# Patient Record
Sex: Female | Born: 1985 | Race: White | Hispanic: No | Marital: Married | Smoking: Never smoker
Health system: Southern US, Community
[De-identification: ages and names within clinical notes are randomized; demographics above are authoritative.]

## PROBLEM LIST (undated history)

## (undated) ENCOUNTER — Inpatient Hospital Stay (HOSPITAL_COMMUNITY): Payer: Self-pay

## (undated) DIAGNOSIS — Z8759 Personal history of other complications of pregnancy, childbirth and the puerperium: Secondary | ICD-10-CM

## (undated) DIAGNOSIS — Z8679 Personal history of other diseases of the circulatory system: Secondary | ICD-10-CM

## (undated) DIAGNOSIS — K219 Gastro-esophageal reflux disease without esophagitis: Secondary | ICD-10-CM

## (undated) DIAGNOSIS — F419 Anxiety disorder, unspecified: Secondary | ICD-10-CM

## (undated) DIAGNOSIS — I10 Essential (primary) hypertension: Secondary | ICD-10-CM

## (undated) DIAGNOSIS — Z9889 Other specified postprocedural states: Secondary | ICD-10-CM

## (undated) DIAGNOSIS — R112 Nausea with vomiting, unspecified: Secondary | ICD-10-CM

## (undated) HISTORY — DX: Gastro-esophageal reflux disease without esophagitis: K21.9

## (undated) HISTORY — DX: Anxiety disorder, unspecified: F41.9

## (undated) HISTORY — PX: MOUTH SURGERY: SHX715

## (undated) HISTORY — DX: Essential (primary) hypertension: I10

## (undated) HISTORY — PX: WISDOM TOOTH EXTRACTION: SHX21

---

## 2006-02-07 ENCOUNTER — Ambulatory Visit: Payer: Self-pay | Admitting: Family Medicine

## 2006-02-22 ENCOUNTER — Ambulatory Visit: Payer: Self-pay | Admitting: Family Medicine

## 2006-03-25 ENCOUNTER — Ambulatory Visit: Payer: Self-pay | Admitting: Family Medicine

## 2016-04-27 ENCOUNTER — Encounter (HOSPITAL_COMMUNITY): Payer: Self-pay | Admitting: *Deleted

## 2016-04-27 ENCOUNTER — Inpatient Hospital Stay (HOSPITAL_COMMUNITY)
Admission: AD | Admit: 2016-04-27 | Discharge: 2016-04-28 | Disposition: A | Discharge status: Other Acute Inpatient Hospital | Payer: BLUE CROSS/BLUE SHIELD | Source: Ambulatory Visit | Attending: Obstetrics and Gynecology | Admitting: Obstetrics and Gynecology

## 2016-04-27 DIAGNOSIS — O321XX0 Maternal care for breech presentation, not applicable or unspecified: Secondary | ICD-10-CM | POA: Diagnosis not present

## 2016-04-27 DIAGNOSIS — O133 Gestational [pregnancy-induced] hypertension without significant proteinuria, third trimester: Secondary | ICD-10-CM | POA: Insufficient documentation

## 2016-04-27 DIAGNOSIS — O42913 Preterm premature rupture of membranes, unspecified as to length of time between rupture and onset of labor, third trimester: Secondary | ICD-10-CM | POA: Diagnosis not present

## 2016-04-27 DIAGNOSIS — Z3A33 33 weeks gestation of pregnancy: Secondary | ICD-10-CM | POA: Diagnosis not present

## 2016-04-27 LAB — COMPREHENSIVE METABOLIC PANEL
ALBUMIN: 2.6 g/dL — AB (ref 3.5–5.0)
ALK PHOS: 121 U/L (ref 38–126)
ALT: 11 U/L — AB (ref 14–54)
AST: 15 U/L (ref 15–41)
Anion gap: 7 (ref 5–15)
BUN: 8 mg/dL (ref 6–20)
CALCIUM: 8.9 mg/dL (ref 8.9–10.3)
CO2: 21 mmol/L — AB (ref 22–32)
CREATININE: 0.51 mg/dL (ref 0.44–1.00)
Chloride: 107 mmol/L (ref 101–111)
GFR calc non Af Amer: >60 mL/min (ref >60)
Glucose, Bld: 99 mg/dL (ref 65–99)
Potassium: 3.7 mmol/L (ref 3.5–5.1)
Sodium: 135 mmol/L (ref 135–145)
Total Bilirubin: 0.4 mg/dL (ref 0.3–1.2)
Total Protein: 6 g/dL — ABNORMAL LOW (ref 6.5–8.1)

## 2016-04-27 LAB — URINALYSIS, ROUTINE W REFLEX MICROSCOPIC
BILIRUBIN URINE: NEGATIVE
GLUCOSE, UA: NEGATIVE mg/dL
Ketones, ur: NEGATIVE mg/dL
Leukocytes, UA: NEGATIVE
Nitrite: NEGATIVE
Protein, ur: NEGATIVE mg/dL
pH: 5.5 (ref 5.0–8.0)

## 2016-04-27 LAB — CBC
HCT: 32 % — ABNORMAL LOW (ref 36.0–46.0)
HEMOGLOBIN: 10.9 g/dL — AB (ref 12.0–15.0)
MCH: 28.4 pg (ref 26.0–34.0)
MCHC: 34.1 g/dL (ref 30.0–36.0)
MCV: 83.3 fL (ref 78.0–100.0)
PLATELETS: 244 10*3/uL (ref 150–400)
RBC: 3.84 MIL/uL — AB (ref 3.87–5.11)
RDW: 13.3 % (ref 11.5–15.5)
WBC: 11.2 10*3/uL — ABNORMAL HIGH (ref 4.0–10.5)

## 2016-04-27 LAB — GROUP B STREP BY PCR: Group B strep by PCR: NEGATIVE

## 2016-04-27 LAB — URIC ACID: Uric Acid, Serum: 3.9 mg/dL (ref 2.3–6.6)

## 2016-04-27 LAB — POCT FERN TEST: POCT Fern Test: POSITIVE

## 2016-04-27 LAB — URINE MICROSCOPIC-ADD ON

## 2016-04-27 MED ORDER — DEXTROSE 5 % IV SOLN
500.0000 mg | Freq: Once | INTRAVENOUS | Status: AC
  Administered 2016-04-27: 500 mg via INTRAVENOUS
  Filled 2016-04-27: qty 500

## 2016-04-27 MED ORDER — BETAMETHASONE SOD PHOS & ACET 6 (3-3) MG/ML IJ SUSP
12.0000 mg | Freq: Once | INTRAMUSCULAR | Status: AC
  Administered 2016-04-27: 12 mg via INTRAMUSCULAR
  Filled 2016-04-27: qty 2

## 2016-04-27 MED ORDER — LABETALOL HCL 5 MG/ML IV SOLN
INTRAVENOUS | Status: DC
Start: 2016-04-27 — End: 2016-04-28
  Filled 2016-04-27: qty 4

## 2016-04-27 MED ORDER — MAGNESIUM SULFATE BOLUS VIA INFUSION
4.0000 g | Freq: Once | INTRAVENOUS | Status: AC
  Administered 2016-04-27: 4 g via INTRAVENOUS
  Filled 2016-04-27 (×2): qty 500

## 2016-04-27 MED ORDER — LABETALOL HCL 5 MG/ML IV SOLN
20.0000 mg | Freq: Once | INTRAVENOUS | Status: AC
  Administered 2016-04-27: 20 mg via INTRAVENOUS

## 2016-04-27 MED ORDER — MAGNESIUM SULFATE 50 % IJ SOLN
2.0000 g/h | INTRAMUSCULAR | Status: DC
  Filled 2016-04-27: qty 80

## 2016-04-27 MED ORDER — SODIUM CHLORIDE 0.9 % IV SOLN
2.0000 g | Freq: Once | INTRAVENOUS | Status: AC
  Administered 2016-04-27: 2 g via INTRAVENOUS
  Filled 2016-04-27: qty 2000

## 2016-04-27 MED ORDER — LACTATED RINGERS IV SOLN
INTRAVENOUS | Status: DC
  Administered 2016-04-27: 23:00:00 via INTRAVENOUS

## 2016-04-27 MED ORDER — MAGNESIUM SULFATE 4 GM/100ML IV SOLN: 4.0000 g | Freq: Once | INTRAVENOUS | Status: DC

## 2016-04-27 NOTE — MAU Note (Signed)
Pt reports ? Leaking fluid since 2000, some lower abd cramping.

## 2016-04-27 NOTE — H&P (Addendum)
Mikayla Becker is a 30 y.o. female presenting @ 72 6/[redacted] weeks gestation with c/o leaking  Clear fluid since 8pm tonight. Pt notes nl fetal activity. PNC complicated by fetal echo done( Dr Elizebeth Brooking) for ? abnl tricuspid valve showed ? Poss ebstein anomaly. Pt is to be delivered at [facility] due to need for pediatric cardiac service there per Dr Elizebeth Brooking. In [place], pt was noted to have elevated BP. Pt denies h/a, visual changes or epigastric pain. Pt noted some RUQ discomfort today. (+) leg swelling. Last sono 8/21: 3lb 3 oz(89%)  OB History    Gravida Para Term Preterm AB Living   1             SAB TAB Ectopic Multiple Live Births                 History reviewed. No pertinent past medical history. History reviewed. No pertinent surgical history. Family History: family history is not on file. Social History:  reports that she has never smoked. She has never used smokeless tobacco. She reports that she does not drink alcohol or use drugs.     Maternal Diabetes: No Genetic Screening: Declined Maternal Ultrasounds/Referrals: Normal Fetal Ultrasounds or other Referrals:  Fetal echo, Other: poss ebstein"s anomaly Maternal Substance Abuse:  No Significant Maternal Medications:  None Significant Maternal Lab Results:  Lab values include: Other: GBS cx pending Other Comments:  seen by Dr Elizebeth Brooking for poss abnormality of tricuspid ? ebstein anomaly  Review of Systems  Eyes: Negative for blurred vision and double vision.  Cardiovascular: Positive for leg swelling.  Gastrointestinal: Positive for abdominal pain.  Neurological: Negative for headaches.   History Dilation: Fingertip (visually) Exam by:: Dr. Cherly Hensen  Blood pressure (!) 139/117, pulse (!) 123, temperature 98.9 F (37.2 C), temperature source Oral, resp. rate 19, height 5' 5.5" (1.664 m), weight 91.2 kg (201 lb), SpO2 99 %.  BP 149/98 (BP Location: Right Arm)   Pulse 108   Temp 98 F (36.7 C) (Oral)   Resp 18   Ht 5' 5.5" (1.664 m)    Wt 91.2 kg (201 lb)   SpO2 99%   BMI 32.94 kg/m   Exam Physical Exam  Constitutional: She is oriented to person, place, and time. She appears well-developed and well-nourished.  HENT:  Head: Atraumatic.  Eyes: EOM are normal.  Neck: Neck supple.  Cardiovascular: Regular rhythm.   Respiratory: Breath sounds normal.  GI: Soft.  Musculoskeletal: She exhibits edema.  Neurological: She is alert and oriented to person, place, and time.  Skin: Skin is warm and dry.  Psychiatric: She has a normal mood and affect.   SSE:  Clear amniotic fluid Cervix closed/long  bedside sono: breech  Prenatal labs: ABO, Rh:  AB positive Antibody:  negative Rubella:  Immune RPR:   NR HBsAg:   neg HIV:   neg GBS:   pending CBC    Component Value Date/Time   WBC 11.2 (H) 04/27/2016 2205   RBC 3.84 (L) 04/27/2016 2205   HGB 10.9 (L) 04/27/2016 2205   HCT 32.0 (L) 04/27/2016 2205   PLT 244 04/27/2016 2205   MCV 83.3 04/27/2016 2205   MCH 28.4 04/27/2016 2205   MCHC 34.1 04/27/2016 2205   RDW 13.3 04/27/2016 2205   CMP Latest Ref Rng & Units 04/27/2016  Glucose 65 - 99 mg/dL 99  BUN 6 - 20 mg/dL 8  Creatinine 1.61 - 0.96 mg/dL 0.45  Sodium 409 - 811 mmol/L 135  Potassium  3.5 - 5.1 mmol/L 3.7  Chloride 101 - 111 mmol/L 107  CO2 22 - 32 mmol/L 21(L)  Calcium 8.9 - 10.3 mg/dL 8.9  Total Protein 6.5 - 8.1 g/dL 6.0(L)  Total Bilirubin 0.3 - 1.2 mg/dL 0.4  Alkaline Phos 38 - 126 U/L 121  AST 15 - 41 U/L 15  ALT 14 - 54 U/L 11(L)   Tracing: baseline 140 (+) accel Ctx 2-564mins Assessment/Plan: PPROM Breech IUP @ 33 6/7 weeks PIH new onset Possible fetal cardiac anomaly P) GBS cx by PCR. Magnesium Sulfate bolus then 2g/hr. Ampicillin/Azithromycin ( PPROm antibiotic protocol), BMZ. PIH labs. [facility]( Dr Eliezer Bottomachel Urriut[facility] due to need for fetal cardiac issue and NICU closed here  Erskine Steinfeldt A 04/27/2016, 10:46 PM  Addendum:  GBS culture neg. Protein/creatinine  ratio pending IV labetalol given for  Elevated BP

## 2016-04-28 ENCOUNTER — Encounter (HOSPITAL_COMMUNITY): Payer: Self-pay

## 2016-04-28 LAB — PROTEIN / CREATININE RATIO, URINE: Creatinine, Urine: 18 mg/dL

## 2016-06-09 ENCOUNTER — Inpatient Hospital Stay (HOSPITAL_COMMUNITY): Admission: AD | Admit: 2016-06-09 | Payer: Self-pay | Source: Ambulatory Visit | Admitting: Obstetrics and Gynecology

## 2017-03-02 ENCOUNTER — Encounter (HOSPITAL_COMMUNITY): Payer: Self-pay

## 2019-10-31 ENCOUNTER — Other Ambulatory Visit: Payer: Self-pay

## 2020-02-14 ENCOUNTER — Ambulatory Visit: Payer: 59 | Attending: Obstetrics and Gynecology

## 2020-02-14 ENCOUNTER — Other Ambulatory Visit: Payer: Self-pay | Admitting: Obstetrics and Gynecology

## 2020-02-14 ENCOUNTER — Ambulatory Visit (HOSPITAL_BASED_OUTPATIENT_CLINIC_OR_DEPARTMENT_OTHER): Payer: 59 | Admitting: Obstetrics and Gynecology

## 2020-02-14 ENCOUNTER — Other Ambulatory Visit: Payer: Self-pay

## 2020-02-14 ENCOUNTER — Ambulatory Visit: Payer: 59 | Admitting: *Deleted

## 2020-02-14 ENCOUNTER — Ambulatory Visit: Payer: 59

## 2020-02-14 DIAGNOSIS — O34219 Maternal care for unspecified type scar from previous cesarean delivery: Secondary | ICD-10-CM

## 2020-02-14 DIAGNOSIS — O09299 Supervision of pregnancy with other poor reproductive or obstetric history, unspecified trimester: Secondary | ICD-10-CM | POA: Diagnosis not present

## 2020-02-14 DIAGNOSIS — O09212 Supervision of pregnancy with history of pre-term labor, second trimester: Secondary | ICD-10-CM | POA: Diagnosis not present

## 2020-02-14 DIAGNOSIS — Z3A27 27 weeks gestation of pregnancy: Secondary | ICD-10-CM | POA: Diagnosis not present

## 2020-02-14 DIAGNOSIS — O3622X0 Maternal care for hydrops fetalis, second trimester, not applicable or unspecified: Secondary | ICD-10-CM | POA: Insufficient documentation

## 2020-02-14 DIAGNOSIS — Z3681 Encounter for antenatal screening for hydrops fetalis: Secondary | ICD-10-CM

## 2020-02-14 DIAGNOSIS — O359XX0 Maternal care for (suspected) fetal abnormality and damage, unspecified, not applicable or unspecified: Secondary | ICD-10-CM | POA: Diagnosis not present

## 2020-02-14 DIAGNOSIS — O350XX0 Maternal care for (suspected) central nervous system malformation in fetus, not applicable or unspecified: Secondary | ICD-10-CM | POA: Insufficient documentation

## 2020-02-14 DIAGNOSIS — Z363 Encounter for antenatal screening for malformations: Secondary | ICD-10-CM

## 2020-02-14 DIAGNOSIS — Z8279 Family history of other congenital malformations, deformations and chromosomal abnormalities: Secondary | ICD-10-CM

## 2020-02-14 DIAGNOSIS — O3509X0 Maternal care for (suspected) other central nervous system malformation or damage in fetus, not applicable or unspecified: Secondary | ICD-10-CM

## 2020-02-14 NOTE — Progress Notes (Signed)
Maternal-Fetal Medicine   Name: Mikayla Becker DOB: 05/20/1996 MRN: [id number] Referring Provider: Maxie Better, MD  I had the pleasure of seeing Mikayla Becker today at the [facility].  She is here for a second-opinion ultrasound and MFM consultation.  At your office ultrasound performed today, fetal hydrops was seen.  Mild ventriculomegaly was also seen.  Her prenatal course has been, otherwise, uneventful.  On cell free fetal DNA screening, the risks of aneuploidies not increased.  MSAFP screening showed low risk for open neural tube defects.  Her blood pressures have been within normal range and she does not have gestational diabetes.  Mid-trimester fetal anatomical survey performed at your office was normal.  She takes weekly 17 OH progesterone injections (history of preterm delivery).  Patient had fetal echocardiography last month that was reported as normal.  Tricuspid valve appeared normal and mild right ventricular dilation was seen.  Patient does not give history of fever or rashes in this pregnancy.  Obstetric history significant for a preterm cesarean delivery at [redacted] weeks gestation in September 2017 at [facility] of a female infant weighing 5 pounds 9 ounces at birth.  Patient had PPROM and breech presentation.  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  Her son is doing well now and did not have cardiac surgery.  Past medical history: No history of hypertension or diabetes.  She had thyroid nodule, but no hyper or hypothyroidism. Past surgical history: Cesarean section. Medications: Prenatal vitamins, Lexapro 20 mg daily, low-dose aspirin, 17-OH progesterone injections. Allergies: No known drug allergies. Social history: Denies tobacco drug or alcohol use.  She has been married 6 years and her husband is in good health.   Family history: No history of venous thromboembolism in the family.  Labs: Hemoglobin  11.4, MCV 82.8, blood type AB positive, antibody screen negative, RPR nonreactive, HIV negative, HBsAg negative, rubella immune, GCT 1 hour 138 mg/dL.  Ultrasound We performed a fetal anatomical survey.  The estimated fetal weight is at the 98th percentile.  Amniotic fluid is normal and good fetal activity seen.  Cephalic presentation.  Following findings are seen: -Severe ascites and generalized skin edema were seen. -No pericardial or pleural effusions are seen. -Intracranial structures, spine, kidneys, face including lips appear normal.  No evidence of ventriculomegaly. -No obvious chest mass or tumors are seen.  -Placenta appears normal with no evidence of tumors or placentomegaly. -Right atrium appears dilated and tricuspid valve insertion appears normal. -I could not identify aortic arch clearly. -Umbilical artery Doppler showed persistent absent end-diastolic flow. -Middle cerebral artery Doppler showed normal peak systolic velocity measurements (no evidence of fetal anemia).  I explained the findings and counseled her on the following:  Hydrops fetalis: -It is accumulation of excess fluid in one or more fetal compartments. -It has several causes including fetal infections, chromosomal anomalies, congenital malformations (including cardiac), fetal tumors, fetal anemia.  -Exact cause may be difficult to determine in some cases; exclusion of some conditions is possible.  -Based on our findings, I strongly suspect cardiac anomaly. Right atrial dilation and non-visualization of aorta can be associated with coarctation of aorta. Ebstein anomaly is a possibility (unlikely).  -Diffferential diagnosis include fetal infections, and I discussed screening for Parvovirus B 19, CMV and Toxoplasmosis infections. Amniotic fluid can also be analyzed for CMV and Toxoplasmosis PCR.  -Fetal hydrops is associated with more-commonly with fetal chromosomal anomalies. I recommended amniocentesis to rule  out fetal chromosomal anomalies. Microarray analysis can  be performed to rule out some (not all) genetic conditions. I discussed the limitations of cell-free fetal DNA screening. I explained amniocentesis procedure and possible complications including preterm delivery (1 in 500 procedures). Preterm delivery is higher in her case because of fetal hydrops and her history of preterm delivery.  -Normal MCA Doppler study is reassuring, and fetal anemia is unlikely (more common with Parvovirus B19 infection).   -Metabolic or genetic causes are likely (rare), and amniocentesis may be able to determine some of these disorders.  -Umbilical artery showed absent-end-diastolic flow that suggests placental insufficiency.  I counseled the couple that hydrops fetalis is associated with a very high fetal mortality (greater than 50%) and the prognosis depends on the cause.  After counseling, the couple informed that they need time to decide on amniocentesis and will discuss with Dr. Cherly Hensen, her obstetrician.  I also discussed meeting with our genetic counselor later.  Patient was sent over to the lab to have her blood drawn (serology) for Parvovirus B19, CMV and toxoplasmosis. We will communicate our results to the patient and fax copies to you.   After the patient left, I called Dr. Dalene Seltzer, pediatric cardiologist [facility] [place]) and discussed the findings. He will be calling the patient tomorrow for an earlier appointment (initially scheduled for 02/22/20).  The couple is aware that if cardiac anomaly is confirmed, she may deliver at [facility].  Recommendations: -Follow-up fetal echocardiography with Dr. Elizebeth Brooking at the [facility]. -Amniocentesis for fetal karyotype, microarray and infectious work-up. -Follow-up ultrasound next week if she is not transferring her care to the [facility].  Thank you for ultrasound and consultation. If you have any questions or concerns, please call me at the  [facility].  Consultation including face-to-face counseling: 50 minutes.

## 2020-02-15 ENCOUNTER — Other Ambulatory Visit: Payer: Self-pay | Admitting: Obstetrics and Gynecology

## 2020-02-15 ENCOUNTER — Ambulatory Visit: Payer: Self-pay | Admitting: Genetic Counselor

## 2020-02-15 ENCOUNTER — Ambulatory Visit: Payer: 59

## 2020-02-15 ENCOUNTER — Ambulatory Visit: Payer: 59 | Admitting: *Deleted

## 2020-02-15 ENCOUNTER — Ambulatory Visit: Payer: 59 | Attending: Obstetrics and Gynecology

## 2020-02-15 VITALS — BP 143/79 | HR 99

## 2020-02-15 DIAGNOSIS — Z36 Encounter for antenatal screening for chromosomal anomalies: Secondary | ICD-10-CM

## 2020-02-15 DIAGNOSIS — Z8279 Family history of other congenital malformations, deformations and chromosomal abnormalities: Secondary | ICD-10-CM | POA: Diagnosis not present

## 2020-02-15 DIAGNOSIS — O359XX0 Maternal care for (suspected) fetal abnormality and damage, unspecified, not applicable or unspecified: Secondary | ICD-10-CM | POA: Diagnosis not present

## 2020-02-15 DIAGNOSIS — O09292 Supervision of pregnancy with other poor reproductive or obstetric history, second trimester: Secondary | ICD-10-CM | POA: Insufficient documentation

## 2020-02-15 DIAGNOSIS — O09212 Supervision of pregnancy with history of pre-term labor, second trimester: Secondary | ICD-10-CM | POA: Diagnosis not present

## 2020-02-15 DIAGNOSIS — O283 Abnormal ultrasonic finding on antenatal screening of mother: Secondary | ICD-10-CM | POA: Insufficient documentation

## 2020-02-15 DIAGNOSIS — Z8759 Personal history of other complications of pregnancy, childbirth and the puerperium: Secondary | ICD-10-CM | POA: Insufficient documentation

## 2020-02-15 DIAGNOSIS — Z3A27 27 weeks gestation of pregnancy: Secondary | ICD-10-CM | POA: Diagnosis not present

## 2020-02-15 DIAGNOSIS — O3622X0 Maternal care for hydrops fetalis, second trimester, not applicable or unspecified: Secondary | ICD-10-CM | POA: Insufficient documentation

## 2020-02-15 DIAGNOSIS — O34219 Maternal care for unspecified type scar from previous cesarean delivery: Secondary | ICD-10-CM | POA: Insufficient documentation

## 2020-02-15 DIAGNOSIS — O0990 Supervision of high risk pregnancy, unspecified, unspecified trimester: Secondary | ICD-10-CM

## 2020-02-15 LAB — PARVOVIRUS B19 ANTIBODY, IGG AND IGM
Parvovirus B19 IgG: 0.2 index (ref 0.0–0.8)
Parvovirus B19 IgM: 0.1 index (ref 0.0–0.8)

## 2020-02-15 LAB — TOXOPLASMA GONDII ANTIBODY, IGG: Toxoplasma IgG Ratio: <3 IU/mL (ref 0.0–7.1)

## 2020-02-15 LAB — INFECT DISEASE AB IGM REFLEX 1

## 2020-02-15 LAB — CMV IGM: CMV IgM Ser EIA-aCnc: <30 AU/mL (ref 0.0–29.9)

## 2020-02-15 LAB — TOXOPLASMA GONDII ANTIBODY, IGM: Toxoplasma Antibody- IgM: <3 AU/mL (ref 0.0–7.9)

## 2020-02-15 LAB — CMV ANTIBODY, IGG (EIA): CMV Ab - IgG: 2.7 U/mL — ABNORMAL HIGH (ref 0.00–0.59)

## 2020-02-15 NOTE — Progress Notes (Signed)
Pt reports headache last night that has resolved this morning, feeling nauseous and stressed. Will repeat BP after amniocentesis.

## 2020-02-15 NOTE — Progress Notes (Signed)
I briefly met with Mikayla Becker and Mikayla Becker today to review testing to be ordered on Mikayla amniocentesis sample. We discussed sending the amniocentesis sample to [facility] for karyotype and CMV analysis. We also discussed the option of ordering a chromosomal microarray to assess for smaller deletions or duplications of chromosomal material that fall below the detection range of karyotype. Finally, we discussed the option of sending a portion of the sample to the laboratory [facility], as they offer a hydrops multigene sequencing panel.   The couple was counseled on the types of results that would be possible from all of this testing. The first type of result is positive. This means that the fetus has a chromosomal change or a pathogenic variant in a gene known to be associated with hydrops. The second type of result is negative. This means that the fetus does not have a chromosomal change or a pathogenic variant in any of the genes that were tested. A negative result does not exclude the possibility of a genetic condition in the current fetus. This is because it is possible that the fetus could carry a variant in a part of a gene that was not sequenced or in a different gene altogether. However, it is also possible that the hydrops in the current fetus does not have a genetic cause, as ~20% of hydrops cases are idiopathic. The third and final result type is a variant of uncertain significance (VUS). A VUS means that a change was identified in one of the chromosomal regions or genes that was tested; however, it is unclear if this change is benign (not associated with hydrops) or pathogenic (is known to be associated with hydrops). In this case, parental testing would likely be recommended. The couple was counseled that the uncertainty surrounding a VUS can be anxiety-provoking for some individuals.   Mikayla Becker and Mikayla Becker understood the benefits and limitations of testing. They indicated that they  were interested in pursuing all of these testing options to gain the most information about the fetus as possible. They understand that it is possible that all of the testing may come back negative.   I informed Mikayla Becker that some insurance companies require prior authorization for chromosomal microarray and multigene panels. We also discussed that it is not guaranteed that [facility] could provide a courtesy forward to [employer] Mikayla Becker requested that we have [facility] hold cells from the amniocentesis sample and order chromosomal microarray and the hydrops panel once prior authorization has been obtained and it is determined that a courtesy forward is possible.   Results from karyotype take 9-12 days to be returned. If chromosomal microarray is ordered, results take 8-15 days to be returned. The couple understands that results may be delayed if cultured cells are needed to complete either of those tests. If the hydrops panel is ordered, results could take 8-10 weeks to be returned. If the fetus is delivered prematurely, it is possible that results from the panel testing may not be back before the baby is born. The couple informed me that termination of pregnancy is not something that they are considering, so a delay in results is not of concern. Additionally, they feel that information from the hydrops panel could still be beneficial in providing information about prognosis, other features that may be expected, and recurrence risks should a positive result be returned.  Both Mikayla Becker and Mikayla Becker confirmed that they had no further questions at this time. I provided them with my  contact information and encouraged them to reach out if they have any questions or concerns before I call them with results.  Following our discussion, I contacted [employer] who informed me that prior authorization is not needed for the CPT codes associated with chromosomal microarray and the hydrops panel.  Additionally, [facility] indicated that they can provide a courtesy forward to [employer] I placed the order for chromosomal microarray so that testing can begin immediately. I will call Mikayla Becker on Monday to inquire how many flasks of cells are needed for testing, then will place the order from the hydrops panel from there. I will also contact Mikayla Becker on Monday to provide Mikayla with these updates.  Mikayla Manis, MS, [facility] Genetic Counselor

## 2020-02-18 ENCOUNTER — Other Ambulatory Visit: Payer: Self-pay

## 2020-02-18 ENCOUNTER — Telehealth: Payer: Self-pay | Admitting: *Deleted

## 2020-02-19 ENCOUNTER — Telehealth: Payer: Self-pay | Admitting: Genetic Counselor

## 2020-02-19 ENCOUNTER — Other Ambulatory Visit: Payer: Self-pay | Admitting: *Deleted

## 2020-02-19 NOTE — Telephone Encounter (Signed)
LVM for Ms. Feigel to update her about testing on her amniocentesis sample. Ms. 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 At that time, we discussed the possibility of ordering karyotype, chromosomal microarray, and a hydrops panel. I informed Ms. Guardiola that her insurance company did not require prior authorization for the chromosomal microarray. However, I called [facility], the laboratory that offers the hydrops gene sequencing panel, and they informed me that they are not in-network with [employer]. I informed Ms. Stigler that I can request gap exception from [employer] to have the cost of the testing covered at an in-network level. However, given that Ms. Rosenberry is now admitted at [facility], it may be possible for her to enroll in a free exome sequencing research study. This would look at all of the genes in the baby as opposed to a selection of some genes known to be associated with hydrops. I asked Ms. Standlee to call me back and let me know if she was interested in pursuing this option instead. In the meantime, I reached out to Beacher May, the genetic counselor at [facility] who is the coordinator for the prenatal exome study, to see if it would be possible to enroll this patient.  Gershon Crane, MS, [facility] Genetic Counselor

## 2020-02-20 ENCOUNTER — Other Ambulatory Visit: Payer: Self-pay | Admitting: *Deleted

## 2020-02-21 ENCOUNTER — Ambulatory Visit: Payer: 59 | Attending: Obstetrics

## 2020-02-21 ENCOUNTER — Ambulatory Visit: Payer: 59 | Admitting: *Deleted

## 2020-02-21 ENCOUNTER — Other Ambulatory Visit: Payer: Self-pay

## 2020-02-21 VITALS — BP 125/75 | HR 93

## 2020-02-21 DIAGNOSIS — Z3A28 28 weeks gestation of pregnancy: Secondary | ICD-10-CM | POA: Diagnosis not present

## 2020-02-21 DIAGNOSIS — O359XX0 Maternal care for (suspected) fetal abnormality and damage, unspecified, not applicable or unspecified: Secondary | ICD-10-CM | POA: Diagnosis not present

## 2020-02-21 DIAGNOSIS — O0990 Supervision of high risk pregnancy, unspecified, unspecified trimester: Secondary | ICD-10-CM

## 2020-02-21 DIAGNOSIS — O34219 Maternal care for unspecified type scar from previous cesarean delivery: Secondary | ICD-10-CM | POA: Diagnosis not present

## 2020-02-21 DIAGNOSIS — Z8279 Family history of other congenital malformations, deformations and chromosomal abnormalities: Secondary | ICD-10-CM

## 2020-02-21 DIAGNOSIS — O09293 Supervision of pregnancy with other poor reproductive or obstetric history, third trimester: Secondary | ICD-10-CM | POA: Diagnosis not present

## 2020-02-21 DIAGNOSIS — O3623X0 Maternal care for hydrops fetalis, third trimester, not applicable or unspecified: Secondary | ICD-10-CM | POA: Diagnosis not present

## 2020-02-21 DIAGNOSIS — O09213 Supervision of pregnancy with history of pre-term labor, third trimester: Secondary | ICD-10-CM

## 2020-02-21 NOTE — Telephone Encounter (Signed)
Received a call back from Ms. Carapia. She informed me that they would like to pursue [facility]'s exome sequencing research study and that Cloyd Stagers had already contacted her this morning to discuss this with her in further detail. Once Ms. Madlock's chromosomal microarray results are returned, I will contact Claiborne Billings and fill out paperwork authorizing [facility] to release some of Ms. Hunsinger's sample from her amniocentesis to [place]. Ms. Rocca husband will collect a sample via saliva kit, and we will facilitate a blood draw for Ms. Witt at one of her upcoming appointments here. I informed Ms. Boulet that if enrolling in the research study does not work out for some reason, I can contact her insurance company and request gap coverage for the hydrops panel we had previously discussed ordering. Ms. Colvin was agreeable to this plan. I will contact her once her results from karyotype and chromosomal microarray become available.  Buelah Manis, MS, [facility] Genetic Counselor

## 2020-02-25 ENCOUNTER — Ambulatory Visit: Payer: 59 | Attending: Obstetrics

## 2020-02-25 ENCOUNTER — Ambulatory Visit: Payer: 59 | Admitting: *Deleted

## 2020-02-25 ENCOUNTER — Encounter: Payer: Self-pay | Admitting: *Deleted

## 2020-02-25 ENCOUNTER — Other Ambulatory Visit: Payer: Self-pay

## 2020-02-25 DIAGNOSIS — Z3A29 29 weeks gestation of pregnancy: Secondary | ICD-10-CM | POA: Diagnosis not present

## 2020-02-25 DIAGNOSIS — O34219 Maternal care for unspecified type scar from previous cesarean delivery: Secondary | ICD-10-CM | POA: Diagnosis not present

## 2020-02-25 DIAGNOSIS — O09293 Supervision of pregnancy with other poor reproductive or obstetric history, third trimester: Secondary | ICD-10-CM

## 2020-02-25 DIAGNOSIS — O09213 Supervision of pregnancy with history of pre-term labor, third trimester: Secondary | ICD-10-CM

## 2020-02-25 DIAGNOSIS — O359XX0 Maternal care for (suspected) fetal abnormality and damage, unspecified, not applicable or unspecified: Secondary | ICD-10-CM | POA: Diagnosis not present

## 2020-02-25 DIAGNOSIS — O3623X0 Maternal care for hydrops fetalis, third trimester, not applicable or unspecified: Secondary | ICD-10-CM | POA: Diagnosis not present

## 2020-02-25 DIAGNOSIS — Z8279 Family history of other congenital malformations, deformations and chromosomal abnormalities: Secondary | ICD-10-CM

## 2020-02-28 ENCOUNTER — Ambulatory Visit: Payer: 59 | Admitting: *Deleted

## 2020-02-28 ENCOUNTER — Ambulatory Visit: Payer: 59 | Attending: Obstetrics

## 2020-02-28 ENCOUNTER — Other Ambulatory Visit: Payer: Self-pay

## 2020-02-28 VITALS — BP 131/80 | HR 108

## 2020-02-28 DIAGNOSIS — O0990 Supervision of high risk pregnancy, unspecified, unspecified trimester: Secondary | ICD-10-CM | POA: Insufficient documentation

## 2020-02-28 DIAGNOSIS — O09213 Supervision of pregnancy with history of pre-term labor, third trimester: Secondary | ICD-10-CM | POA: Diagnosis not present

## 2020-02-28 DIAGNOSIS — Z8279 Family history of other congenital malformations, deformations and chromosomal abnormalities: Secondary | ICD-10-CM

## 2020-02-28 DIAGNOSIS — O09293 Supervision of pregnancy with other poor reproductive or obstetric history, third trimester: Secondary | ICD-10-CM | POA: Diagnosis not present

## 2020-02-28 DIAGNOSIS — O3623X0 Maternal care for hydrops fetalis, third trimester, not applicable or unspecified: Secondary | ICD-10-CM | POA: Diagnosis not present

## 2020-02-28 DIAGNOSIS — O359XX0 Maternal care for (suspected) fetal abnormality and damage, unspecified, not applicable or unspecified: Secondary | ICD-10-CM

## 2020-02-28 DIAGNOSIS — Z3A29 29 weeks gestation of pregnancy: Secondary | ICD-10-CM | POA: Diagnosis not present

## 2020-02-28 DIAGNOSIS — O34219 Maternal care for unspecified type scar from previous cesarean delivery: Secondary | ICD-10-CM

## 2020-03-03 ENCOUNTER — Other Ambulatory Visit: Payer: Self-pay | Admitting: *Deleted

## 2020-03-03 ENCOUNTER — Encounter: Payer: Self-pay | Admitting: Pediatrics

## 2020-03-03 ENCOUNTER — Other Ambulatory Visit: Payer: Self-pay

## 2020-03-03 ENCOUNTER — Other Ambulatory Visit: Payer: Self-pay | Admitting: Obstetrics

## 2020-03-03 ENCOUNTER — Ambulatory Visit: Payer: 59 | Attending: Obstetrics

## 2020-03-03 ENCOUNTER — Ambulatory Visit: Payer: 59 | Admitting: *Deleted

## 2020-03-03 ENCOUNTER — Encounter: Payer: Self-pay | Admitting: *Deleted

## 2020-03-03 DIAGNOSIS — Z3A30 30 weeks gestation of pregnancy: Secondary | ICD-10-CM

## 2020-03-03 DIAGNOSIS — O34219 Maternal care for unspecified type scar from previous cesarean delivery: Secondary | ICD-10-CM | POA: Diagnosis not present

## 2020-03-03 DIAGNOSIS — O358XX0 Maternal care for other (suspected) fetal abnormality and damage, not applicable or unspecified: Secondary | ICD-10-CM

## 2020-03-03 DIAGNOSIS — O283 Abnormal ultrasonic finding on antenatal screening of mother: Secondary | ICD-10-CM | POA: Insufficient documentation

## 2020-03-03 DIAGNOSIS — O09293 Supervision of pregnancy with other poor reproductive or obstetric history, third trimester: Secondary | ICD-10-CM

## 2020-03-03 DIAGNOSIS — O09213 Supervision of pregnancy with history of pre-term labor, third trimester: Secondary | ICD-10-CM | POA: Diagnosis not present

## 2020-03-03 DIAGNOSIS — Z8279 Family history of other congenital malformations, deformations and chromosomal abnormalities: Secondary | ICD-10-CM

## 2020-03-03 DIAGNOSIS — Z362 Encounter for other antenatal screening follow-up: Secondary | ICD-10-CM

## 2020-03-03 LAB — MCC TRACKING

## 2020-03-03 NOTE — Procedures (Signed)
Mikayla Becker [birth year] [redacted]w[redacted]d  Fetus A Non-Stress Test Interpretation for 03/03/20  Indication: Unsatisfactory BPP  Fetal Heart Rate A Mode: External Baseline Rate (A): 135 bpm Variability: Moderate Accelerations: 10 x 10 Decelerations: None Multiple birth?: No  Uterine Activity Mode: Palpation, Toco Contraction Frequency (min): none noted Resting Tone Palpated: Relaxed Resting Time: Adequate  Interpretation (Fetal Testing) Nonstress Test Interpretation: Reactive Comments: Reviewed tracing with Dr. Parke Poisson

## 2020-03-04 ENCOUNTER — Telehealth: Payer: Self-pay | Admitting: Genetic Counselor

## 2020-03-04 NOTE — Telephone Encounter (Signed)
I called Mikayla Becker and her husband Mikayla Becker to discuss the negative karyotype results from amniocentesis. Results from fetal karyotype revealed a normal female complement (46,XY). This significantly reduces the possibility of a chromosomal aneuploidy such as Down syndrome, trisomy 73, or trisomy 30 in this fetus, a rearrangement of chromosomal material, and large deletions or duplications of chromosomal material, as amniocentesis is able to diagnose these with a sensitivity of >99%. I reminded the couple that results from chromosomal microarray are still pending. Chromosomal microarray can identify any smaller missing or extra pieces of chromosomal material that would fall below the detection range for karyotype. I informed them that I heard from [facility] last week that results from the chromosomal microarray were delayed, but should be available early this week. However, I have not yet gotten these results. I will call [facility] tomorrow morning if results are still not available by that point, then call the couple with an update. I also reminded them that if chromosomal microarray analysis is normal and if they are still interested, I can help facilitate a transfer of the sample from [facility] to [facility] for their whole exome sequencing research study. The couple was agreeable to this plan and confirmed that they had no further questions at this time.   Gershon Crane, MS, [facility] Genetic Counselor

## 2020-03-06 ENCOUNTER — Ambulatory Visit: Payer: 59

## 2020-03-06 ENCOUNTER — Telehealth: Payer: Self-pay | Admitting: Genetic Counselor

## 2020-03-06 LAB — SNP MICROARRAY-PRENATAL (REVEAL)

## 2020-03-06 LAB — CHROMOSOME, AMNIOTIC FLUID
Cells Analyzed: 15
Cells Counted: 15
Cells Karyotyped: 2
Colonies: 15
GTG Band Resolution Achieved: 450

## 2020-03-06 LAB — SPECIMEN STATUS REPORT

## 2020-03-06 NOTE — Telephone Encounter (Signed)
I called Ms. Yonts to give her an update on the status of her chromosomal microarray. I checked in with genetic counselor Vernona Rieger at [facility] yesterday who told me that the lab is still in the process of growing cells from Ms. Manzella's amniocentesis sample. Cell culture was needed as there was some blood in the sample's pellet. Unfortunately, the individual from [facility] who called me last week was not aware of the need for cell culture, so their estimate of getting results back this week was inaccurate. Once cells have been grown, chromosomal microarray will be completed. I will continue to keep Ms. Lanigan updated. She confirmed that she had no further questions at this time.  Gershon Crane, MS, [facility] Genetic Counselor

## 2020-03-10 ENCOUNTER — Ambulatory Visit: Payer: 59 | Attending: Obstetrics

## 2020-03-10 ENCOUNTER — Ambulatory Visit: Payer: 59 | Admitting: *Deleted

## 2020-03-10 ENCOUNTER — Other Ambulatory Visit: Payer: Self-pay

## 2020-03-10 ENCOUNTER — Encounter: Payer: Self-pay | Admitting: *Deleted

## 2020-03-10 ENCOUNTER — Other Ambulatory Visit: Payer: Self-pay | Admitting: Obstetrics

## 2020-03-10 DIAGNOSIS — O359XX0 Maternal care for (suspected) fetal abnormality and damage, unspecified, not applicable or unspecified: Secondary | ICD-10-CM

## 2020-03-10 DIAGNOSIS — Z3A31 31 weeks gestation of pregnancy: Secondary | ICD-10-CM

## 2020-03-10 DIAGNOSIS — Z8279 Family history of other congenital malformations, deformations and chromosomal abnormalities: Secondary | ICD-10-CM

## 2020-03-10 DIAGNOSIS — O09213 Supervision of pregnancy with history of pre-term labor, third trimester: Secondary | ICD-10-CM

## 2020-03-10 DIAGNOSIS — O34219 Maternal care for unspecified type scar from previous cesarean delivery: Secondary | ICD-10-CM

## 2020-03-12 LAB — MCC TRACKING

## 2020-03-14 ENCOUNTER — Telehealth: Payer: Self-pay | Admitting: Genetic Counselor

## 2020-03-14 LAB — DIRECT PRENATAL SNP CMA

## 2020-03-14 LAB — CYTOMEGALOVIRUS (CMV), AMNIOTIC FLUID, PCR: CMV PCR, Amniotic: NEGATIVE

## 2020-03-14 LAB — MATERNAL CELL CONTAMINATION

## 2020-03-14 LAB — SPECIMEN STATUS REPORT

## 2020-03-14 NOTE — Telephone Encounter (Signed)
I called Ms. Stith to discuss her negative chromosomal microarray results from amniocentesis. Microarray analysis revealed a normal female complement with no significant DNA copy number changes or copy neutral regions and no maternal cell contamination. This significantly reduces the possibility of a chromosomal microdeletion or microduplication syndrome in this fetus. We reviewed that all of the testing that was ordered on her amniocentesis sample (karyotype, chromosomal microarray, and CMV analysis) is now complete and all were normal. Thus, a CMV infection in the pregnancy and clinically significant chromosomal abnormalities have been ruled out as a cause for the hydrops on ultrasound.  Ms. Tompson informed me that she is going to complete the research consent forms for [facility]'s whole exome sequencing research study and send them to Beacher May after our phone call. I also emailed Tresa Endo to begin the process of transferring a portion of Ms. Steffek's sample from [employer] [facility] to [facility] for analysis. Whole exome sequencing will evaluate all of the fetus's protein-coding genes to determine if there is a single-gene cause that explains the hydrops and fetal heart defect identified on ultrasound.   Ms. Scritchfield confirmed that she had no further questions at this time. I encouraged her to contact me should any other questions or concerns arise.   Gershon Crane, MS, [facility] Genetic Counselor

## 2020-03-17 ENCOUNTER — Other Ambulatory Visit: Payer: Self-pay | Admitting: *Deleted

## 2020-03-17 ENCOUNTER — Ambulatory Visit: Payer: 59 | Attending: Obstetrics

## 2020-03-17 ENCOUNTER — Other Ambulatory Visit: Payer: Self-pay | Admitting: Obstetrics

## 2020-03-17 ENCOUNTER — Other Ambulatory Visit: Payer: Self-pay

## 2020-03-17 ENCOUNTER — Encounter: Payer: Self-pay | Admitting: *Deleted

## 2020-03-17 ENCOUNTER — Ambulatory Visit: Payer: 59 | Admitting: *Deleted

## 2020-03-17 DIAGNOSIS — O34219 Maternal care for unspecified type scar from previous cesarean delivery: Secondary | ICD-10-CM

## 2020-03-17 DIAGNOSIS — Z3683 Encounter for fetal screening for congenital cardiac abnormalities: Secondary | ICD-10-CM | POA: Diagnosis not present

## 2020-03-17 DIAGNOSIS — O09293 Supervision of pregnancy with other poor reproductive or obstetric history, third trimester: Secondary | ICD-10-CM

## 2020-03-17 DIAGNOSIS — Z8279 Family history of other congenital malformations, deformations and chromosomal abnormalities: Secondary | ICD-10-CM

## 2020-03-17 DIAGNOSIS — Z3A32 32 weeks gestation of pregnancy: Secondary | ICD-10-CM

## 2020-03-17 DIAGNOSIS — O358XX0 Maternal care for other (suspected) fetal abnormality and damage, not applicable or unspecified: Secondary | ICD-10-CM

## 2020-03-17 DIAGNOSIS — O359XX0 Maternal care for (suspected) fetal abnormality and damage, unspecified, not applicable or unspecified: Secondary | ICD-10-CM | POA: Diagnosis not present

## 2020-03-17 DIAGNOSIS — O09219 Supervision of pregnancy with history of pre-term labor, unspecified trimester: Secondary | ICD-10-CM | POA: Diagnosis not present

## 2020-03-17 DIAGNOSIS — O35BXX0 Maternal care for other (suspected) fetal abnormality and damage, fetal cardiac anomalies, not applicable or unspecified: Secondary | ICD-10-CM

## 2020-03-24 ENCOUNTER — Other Ambulatory Visit: Payer: Self-pay | Admitting: *Deleted

## 2020-03-24 ENCOUNTER — Other Ambulatory Visit: Payer: Self-pay

## 2020-03-24 ENCOUNTER — Ambulatory Visit: Payer: 59 | Admitting: *Deleted

## 2020-03-24 ENCOUNTER — Ambulatory Visit: Payer: 59 | Attending: Obstetrics and Gynecology

## 2020-03-24 ENCOUNTER — Encounter: Payer: Self-pay | Admitting: *Deleted

## 2020-03-24 DIAGNOSIS — O35BXX0 Maternal care for other (suspected) fetal abnormality and damage, fetal cardiac anomalies, not applicable or unspecified: Secondary | ICD-10-CM

## 2020-03-24 DIAGNOSIS — O09213 Supervision of pregnancy with history of pre-term labor, third trimester: Secondary | ICD-10-CM

## 2020-03-24 DIAGNOSIS — O359XX0 Maternal care for (suspected) fetal abnormality and damage, unspecified, not applicable or unspecified: Secondary | ICD-10-CM | POA: Diagnosis not present

## 2020-03-24 DIAGNOSIS — O359XX1 Maternal care for (suspected) fetal abnormality and damage, unspecified, fetus 1: Secondary | ICD-10-CM | POA: Diagnosis not present

## 2020-03-24 DIAGNOSIS — O09293 Supervision of pregnancy with other poor reproductive or obstetric history, third trimester: Secondary | ICD-10-CM | POA: Diagnosis not present

## 2020-03-24 DIAGNOSIS — O358XX0 Maternal care for other (suspected) fetal abnormality and damage, not applicable or unspecified: Secondary | ICD-10-CM | POA: Insufficient documentation

## 2020-03-24 DIAGNOSIS — Z3A33 33 weeks gestation of pregnancy: Secondary | ICD-10-CM | POA: Diagnosis not present

## 2020-03-24 DIAGNOSIS — Z8279 Family history of other congenital malformations, deformations and chromosomal abnormalities: Secondary | ICD-10-CM

## 2020-03-31 ENCOUNTER — Ambulatory Visit: Payer: 59 | Attending: Obstetrics and Gynecology

## 2020-03-31 ENCOUNTER — Ambulatory Visit: Payer: 59 | Admitting: *Deleted

## 2020-03-31 ENCOUNTER — Other Ambulatory Visit: Payer: Self-pay

## 2020-03-31 ENCOUNTER — Encounter: Payer: Self-pay | Admitting: *Deleted

## 2020-03-31 DIAGNOSIS — O359XX0 Maternal care for (suspected) fetal abnormality and damage, unspecified, not applicable or unspecified: Secondary | ICD-10-CM | POA: Diagnosis not present

## 2020-03-31 DIAGNOSIS — O358XX0 Maternal care for other (suspected) fetal abnormality and damage, not applicable or unspecified: Secondary | ICD-10-CM | POA: Diagnosis not present

## 2020-03-31 DIAGNOSIS — O09293 Supervision of pregnancy with other poor reproductive or obstetric history, third trimester: Secondary | ICD-10-CM | POA: Diagnosis not present

## 2020-03-31 DIAGNOSIS — O09213 Supervision of pregnancy with history of pre-term labor, third trimester: Secondary | ICD-10-CM

## 2020-03-31 DIAGNOSIS — O3623X0 Maternal care for hydrops fetalis, third trimester, not applicable or unspecified: Secondary | ICD-10-CM | POA: Diagnosis not present

## 2020-03-31 DIAGNOSIS — Z3A34 34 weeks gestation of pregnancy: Secondary | ICD-10-CM | POA: Diagnosis not present

## 2020-03-31 DIAGNOSIS — Z8279 Family history of other congenital malformations, deformations and chromosomal abnormalities: Secondary | ICD-10-CM | POA: Insufficient documentation

## 2020-04-08 ENCOUNTER — Ambulatory Visit: Payer: 59 | Admitting: *Deleted

## 2020-04-08 ENCOUNTER — Other Ambulatory Visit: Payer: Self-pay

## 2020-04-08 ENCOUNTER — Ambulatory Visit: Payer: 59 | Attending: Obstetrics and Gynecology

## 2020-04-08 ENCOUNTER — Other Ambulatory Visit: Payer: Self-pay | Admitting: Obstetrics

## 2020-04-08 ENCOUNTER — Encounter: Payer: Self-pay | Admitting: *Deleted

## 2020-04-08 DIAGNOSIS — O3623X1 Maternal care for hydrops fetalis, third trimester, fetus 1: Secondary | ICD-10-CM | POA: Diagnosis not present

## 2020-04-08 DIAGNOSIS — Z3A35 35 weeks gestation of pregnancy: Secondary | ICD-10-CM | POA: Diagnosis not present

## 2020-04-08 DIAGNOSIS — O337XX1 Maternal care for disproportion due to other fetal deformities, fetus 1: Secondary | ICD-10-CM

## 2020-04-08 DIAGNOSIS — O358XX1 Maternal care for other (suspected) fetal abnormality and damage, fetus 1: Secondary | ICD-10-CM

## 2020-04-08 DIAGNOSIS — O3623X0 Maternal care for hydrops fetalis, third trimester, not applicable or unspecified: Secondary | ICD-10-CM | POA: Insufficient documentation

## 2020-04-08 DIAGNOSIS — Z3683 Encounter for fetal screening for congenital cardiac abnormalities: Secondary | ICD-10-CM | POA: Diagnosis not present

## 2020-04-08 DIAGNOSIS — O358XX0 Maternal care for other (suspected) fetal abnormality and damage, not applicable or unspecified: Secondary | ICD-10-CM | POA: Diagnosis not present

## 2020-04-08 DIAGNOSIS — O09213 Supervision of pregnancy with history of pre-term labor, third trimester: Secondary | ICD-10-CM

## 2020-04-08 DIAGNOSIS — Z8279 Family history of other congenital malformations, deformations and chromosomal abnormalities: Secondary | ICD-10-CM | POA: Insufficient documentation

## 2020-04-08 DIAGNOSIS — O359XX0 Maternal care for (suspected) fetal abnormality and damage, unspecified, not applicable or unspecified: Secondary | ICD-10-CM

## 2020-04-08 DIAGNOSIS — O09293 Supervision of pregnancy with other poor reproductive or obstetric history, third trimester: Secondary | ICD-10-CM

## 2021-01-27 IMAGING — US US MFM UA CORD DOPPLER
1 series · 14 of 28 positions shown · non-contrast
Comparison: none

[Series 1: us mfm ua cord doppler · 31 acquisitions, 14 frames shown]
[im 2/31]
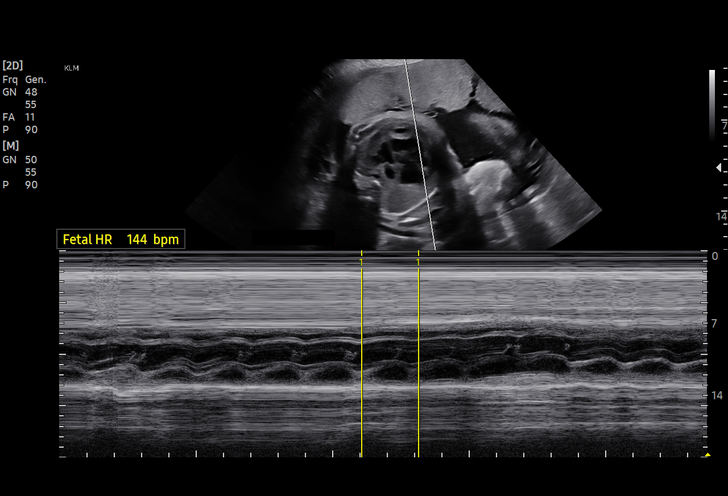
[im 4/31]
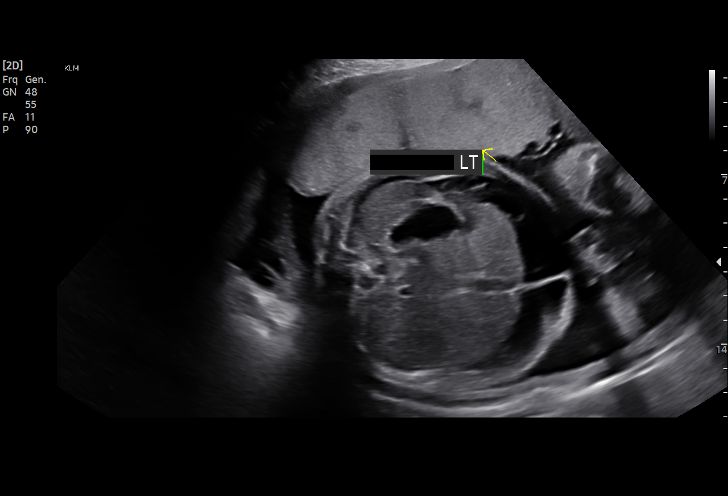
[im 6/31]
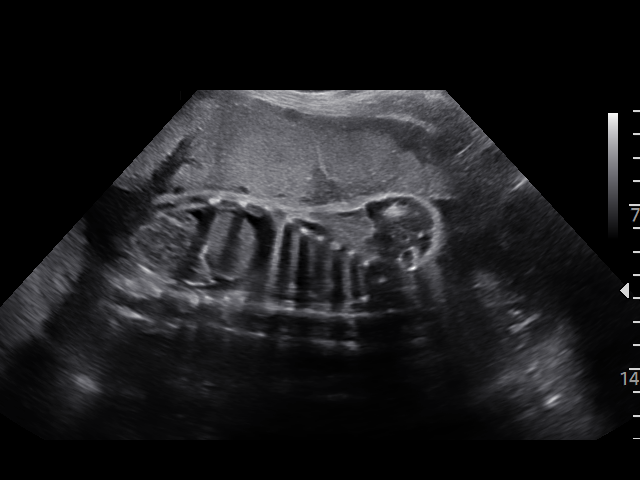
[im 8/31]
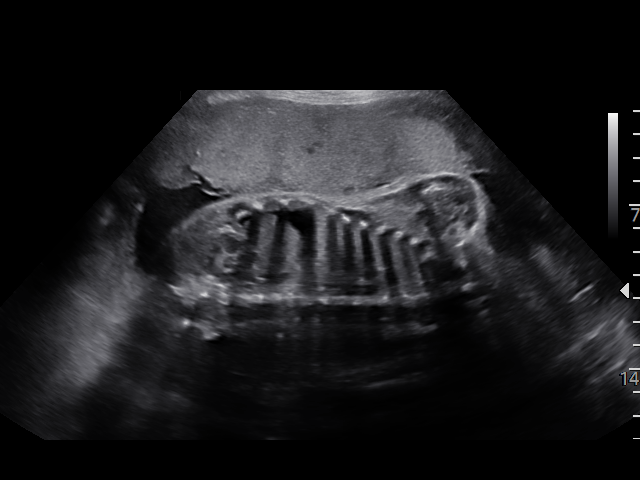
[im 11/31]
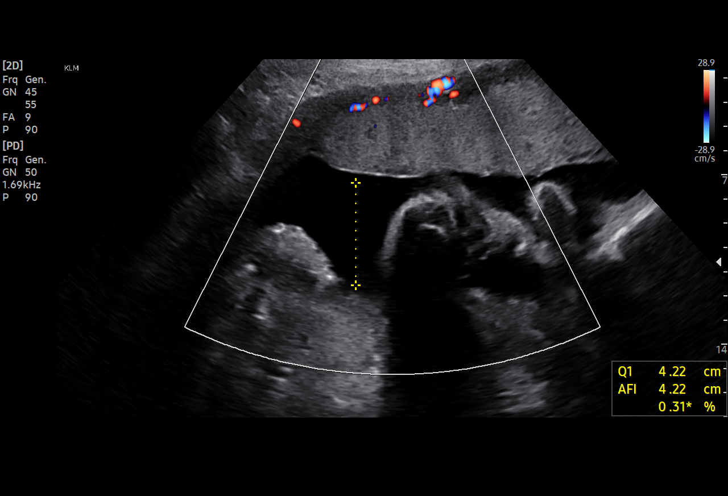
[im 13/31]
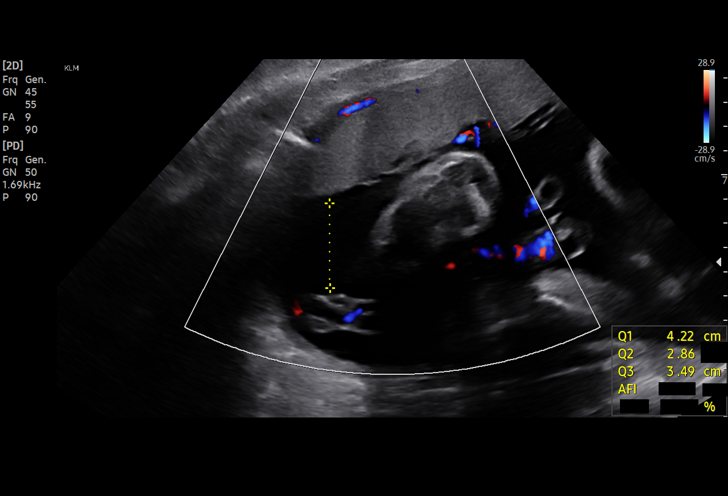
[im 15/31]
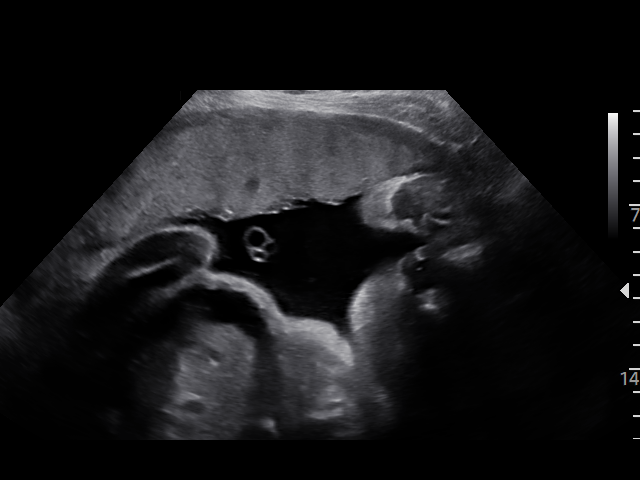
[im 17/31]
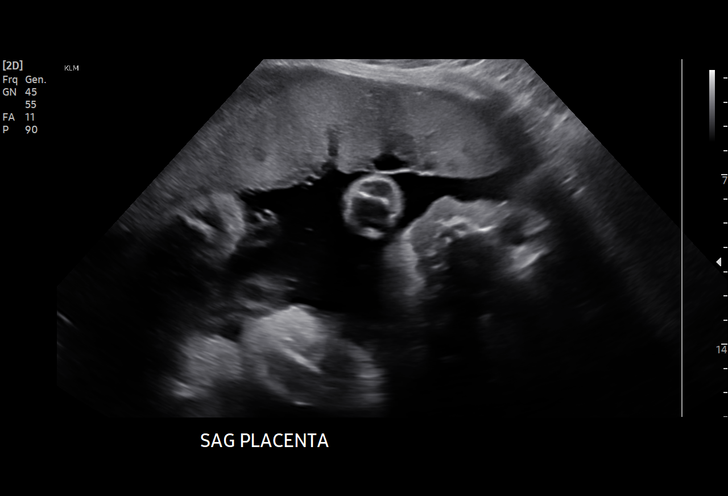
[im 19/31]
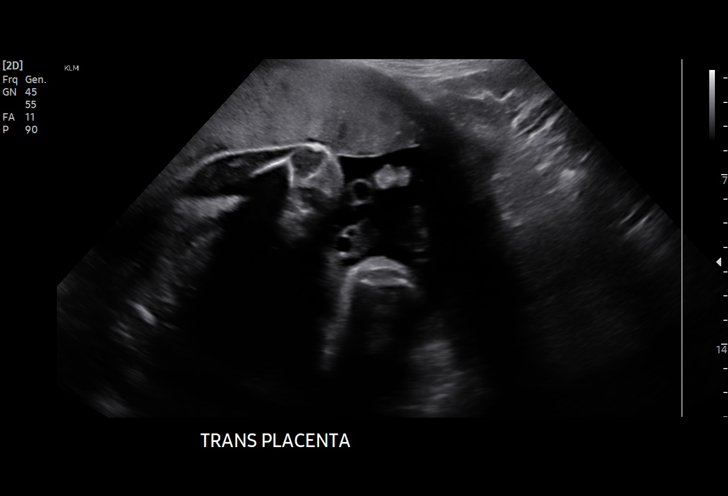
[im 22/31]
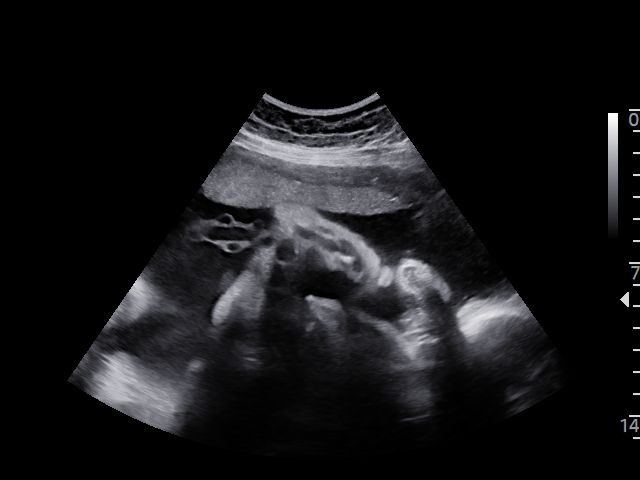
[im 24/31]
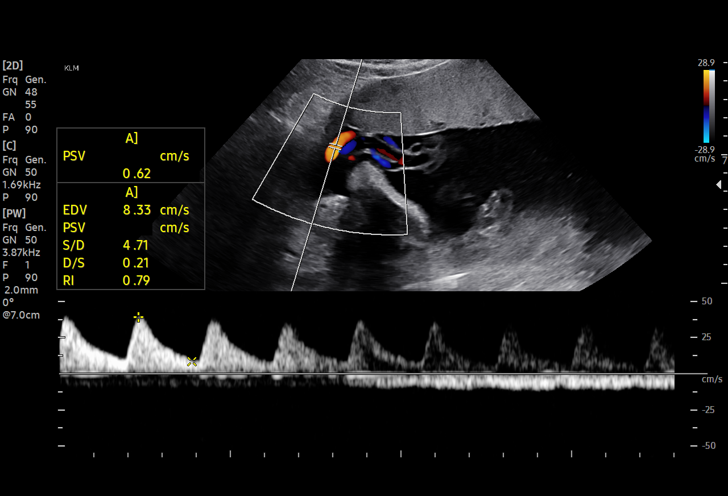
[im 26/31]
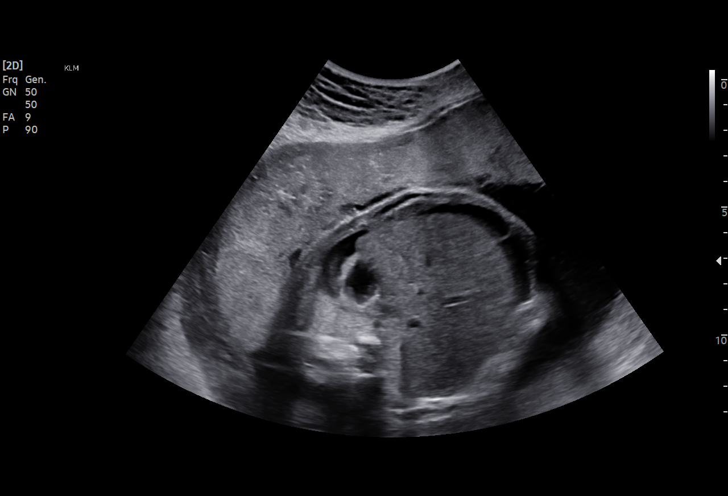
[im 28/31]
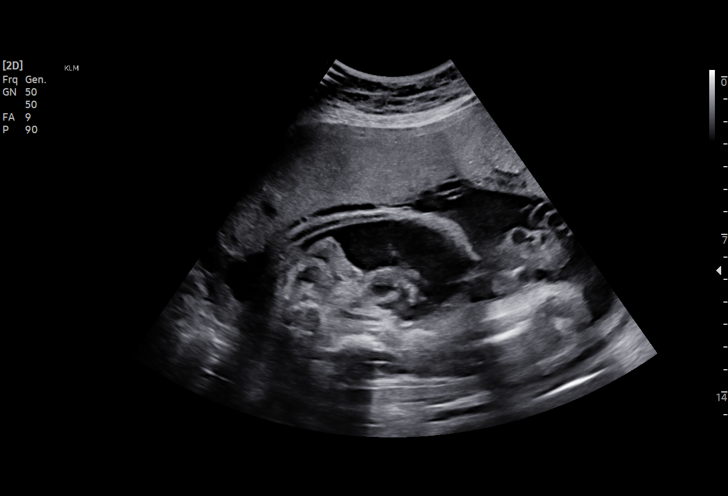
[im 31/31]
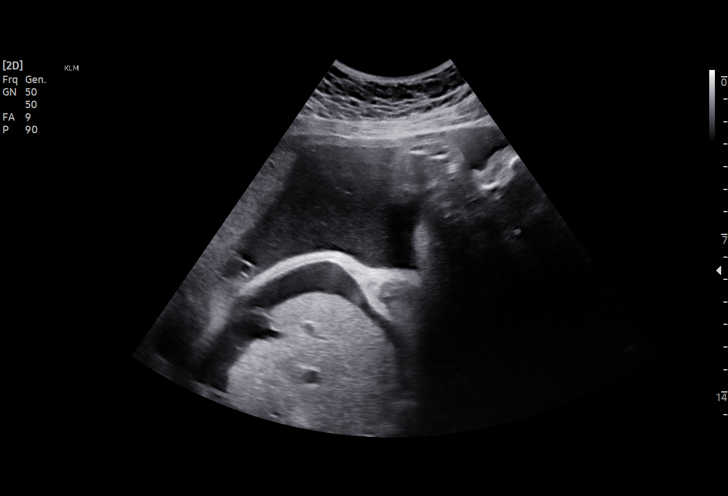

[14 of 28 positions shown; findings below may reference images not displayed]

OB/GYN &
                                                            [facility]

Indications

 Fetal abnormality - other known or
 suspected (fetal hydrops, enlarged right side
 of the heart, ascites)
 33 weeks gestation of pregnancy
 Family history of congenital anomaly (son,
 heart defect)
 Poor obstetric history: Previous preterm
 delivery, antepartum
 Poor obstetric history: Previous
 preeclampsia / eclampsia/gestational HTN
 Low risk NIPS
Fetal Evaluation

 Num Of Fetuses:         1
 Fetal Heart Rate(bpm):  144
 Cardiac Activity:       Observed
 Presentation:           Cephalic
 Placenta:               Anterior
 Amniotic Fluid
 AFI FV:      Within normal limits

 AFI Sum(cm)     %Tile       Largest Pocket(cm)
 14.33           50

 RUQ(cm)       RLQ(cm)       LUQ(cm)        LLQ(cm)

Biophysical Evaluation

 Amniotic F.V:   Within normal limits       F. Tone:        Observed
 F. Movement:    Observed                   Score:          [DATE]
 F. Breathing:   Observed
OB History

 Gravidity:    2         Term:   1        Prem:   0        SAB:   0
 TOP:          0       Ectopic:  `        Living: 1
Gestational Age

 LMP:           33w 2d        Date:  08/04/19                 EDD:   05/10/20
 Best:          33w 2d     Det. By:  LMP  (08/04/19)          EDD:   05/10/20
Anatomy

 Stomach:               Appears normal, left   Spine:                  Appears normal
                        sided
 Abdomen:               Ascites
Doppler - Fetal Vessels

 Umbilical Artery
  S/D     %tile      RI    %tile                      PSV    ADFV    RDFV
                                                    (cm/s)
  5.82   > 97.5    0.83   > 97.5                     39.42      No      No

Comments

 This patient was seen for a biophysical profile due to fetal
 abdominal ascites and cardiac defect.  She reports that her
 cesarean delivery is now scheduled for April 10, 2020.
 She denies any problems since her last exam and reports
 feeling fetal movements throughout the day.
 A biophysical profile performed today was [DATE].
 There was normal amniotic fluid noted on today's ultrasound
 exam.  The fetal abdominal ascites continues to be noted.  It
 appears stable as compared to her prior exams.
 Doppler studies of the umbilical arteries performed today
 continues to show normal forward flow with an elevated S/D
 ratio of 5.82.  There were no signs of absent or reversed end-
 diastolic flow noted today.
 She will continue twice weekly fetal testing, once in our office
 and once at [HOSPITAL] [facility].
 Another exam was scheduled in 1 week.

## 2021-02-03 IMAGING — US US MFM FETAL BPP W/O NON-STRESS
1 series · 15 of 28 positions shown · non-contrast
Comparison: none

[Series 1: us mfm fetal bpp w/o non-stress · 45 acquisitions, 15 frames shown]
[im 1/45]
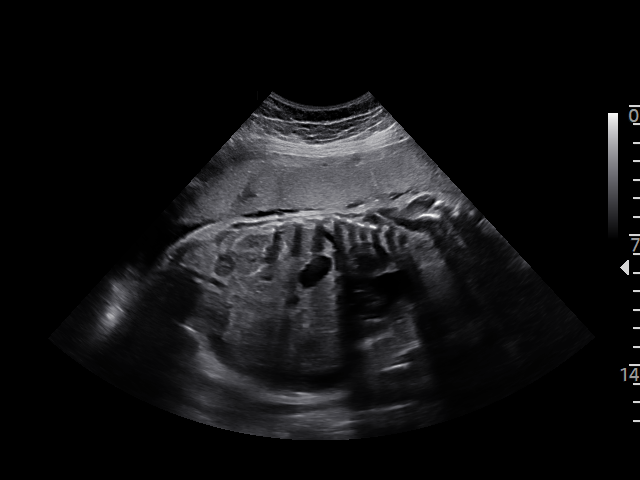
[im 4/45]
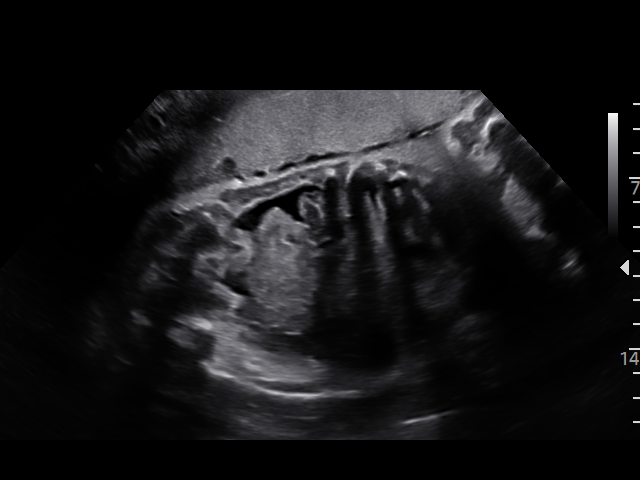
[im 7/45]
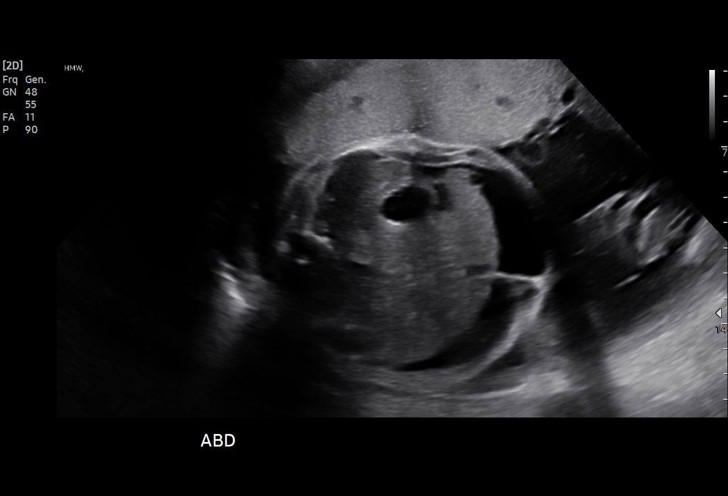
[im 10/45]
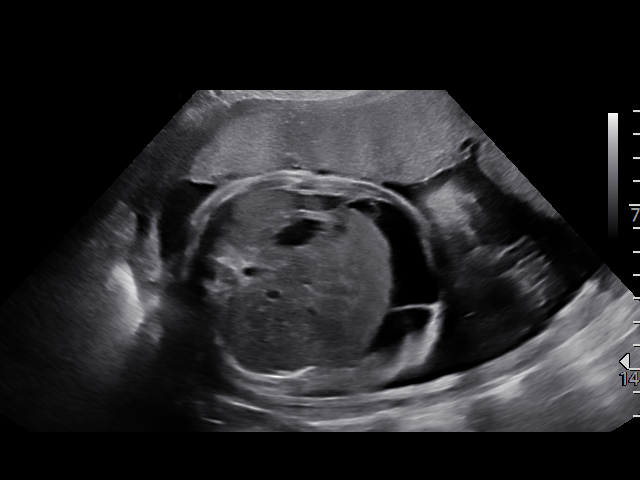
[im 14/45]
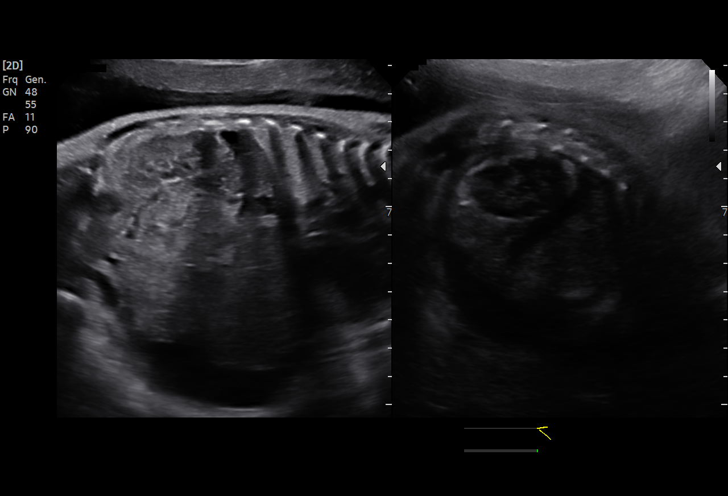
[im 17/45]
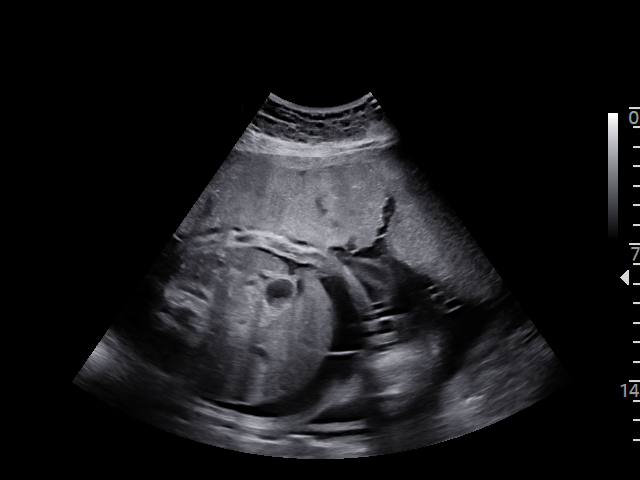
[im 20/45]
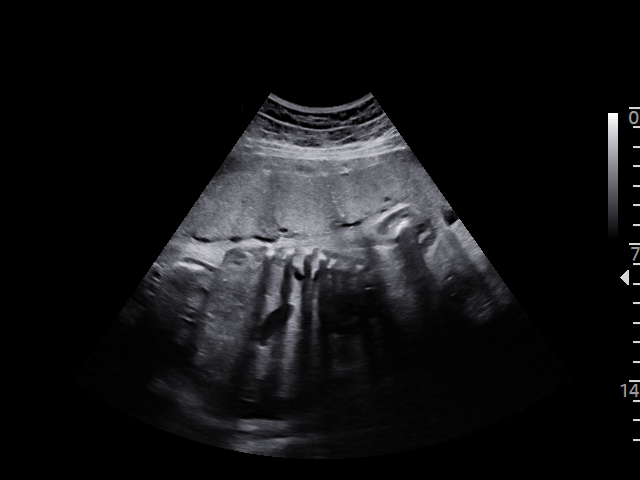
[im 23/45]
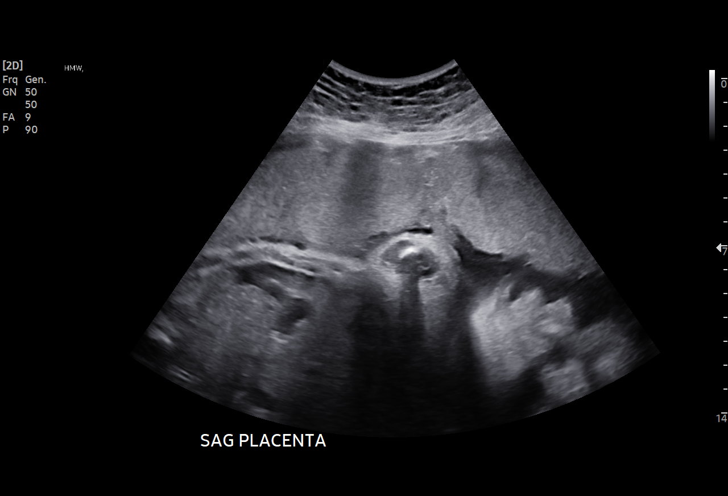
[im 25/45]
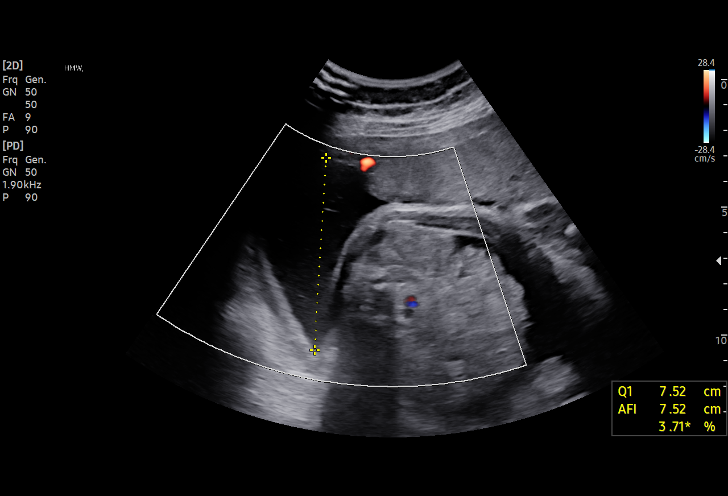
[im 28/45]
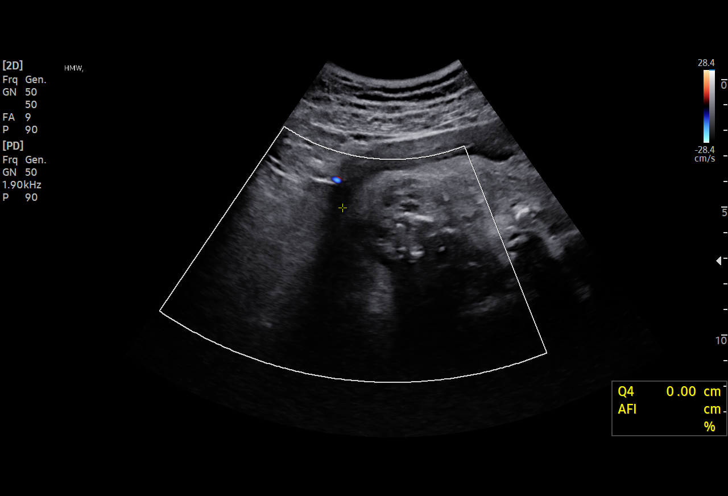
[im 31/45]
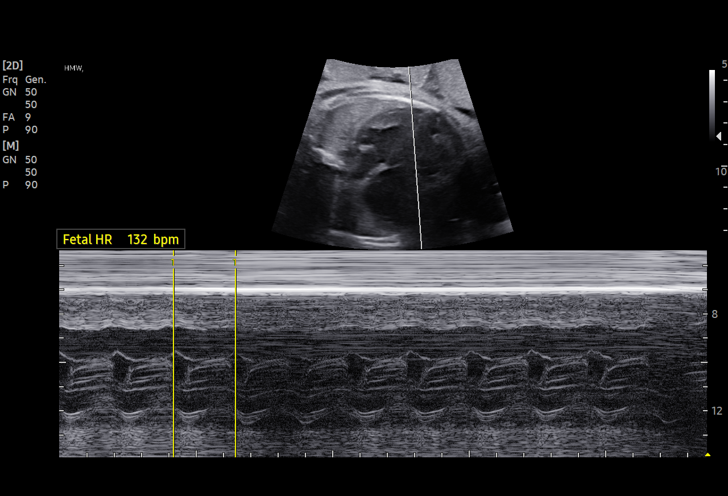
[im 35/45]
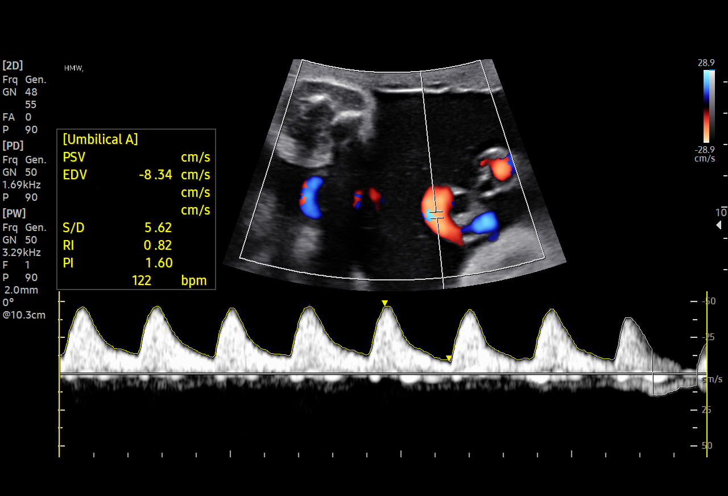
[im 38/45]
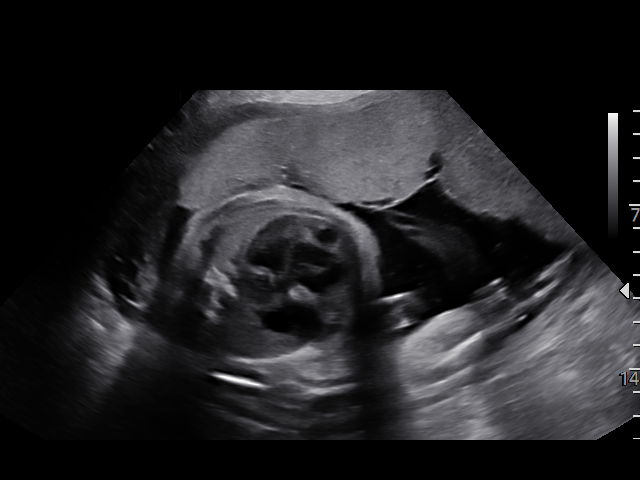
[im 41/45]
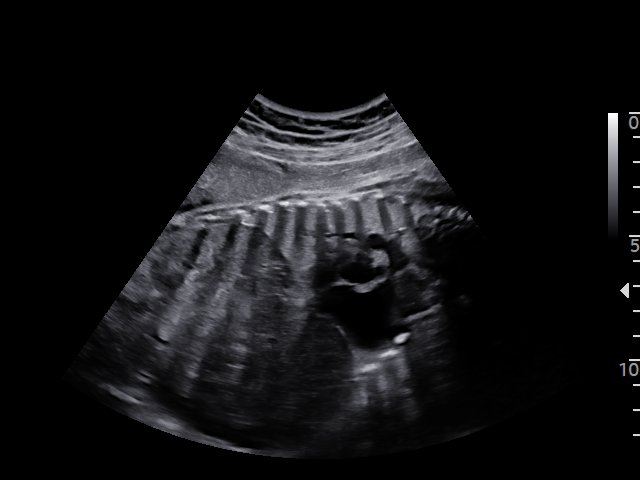
[im 45/45]
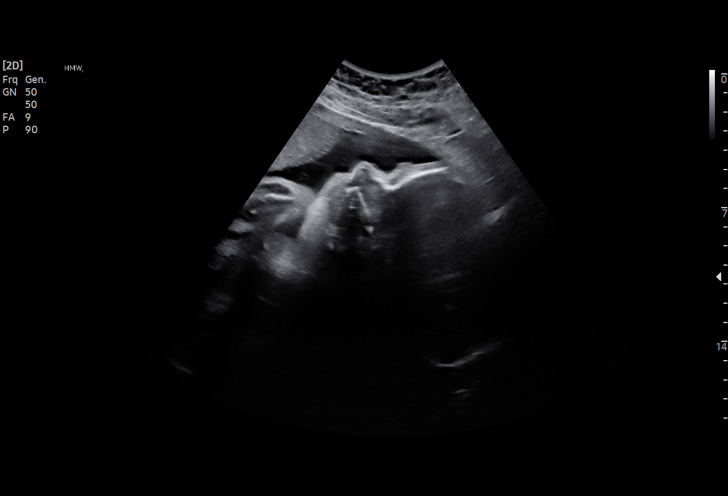

[15 of 28 positions shown; findings below may reference images not displayed]

OB/GYN &
                                                            [facility]

Indications

 Fetal abnormality - other known or
 suspected (fetal hydrops, enlarged right side
 of the heart, ascites)
 34 weeks gestation of pregnancy
 Family history of congenital anomaly (son,
 heart defect)
 Poor obstetric history: Previous preterm
 delivery, antepartum
 Poor obstetric history: Previous
 preeclampsia / eclampsia/gestational HTN
 Low risk NIPS
Fetal Evaluation

 Num Of Fetuses:         1
 Fetal Heart Rate(bpm):  132
 Cardiac Activity:       Observed
 Presentation:           Cephalic

 Amniotic Fluid
 AFI FV:      Within normal limits

 AFI Sum(cm)     %Tile       Largest Pocket(cm)
 13.88           48
 RUQ(cm)       RLQ(cm)       LUQ(cm)        LLQ(cm)
 7.52          0
Biophysical Evaluation

 Amniotic F.V:   Within normal limits       F. Tone:        Observed
 F. Movement:    Observed                   Score:          [DATE]
 F. Breathing:   Observed
OB History

 Gravidity:    2         Term:   1        Prem:   0        SAB:   0
 TOP:          0       Ectopic:  `        Living: 1
Gestational Age

 LMP:           34w 2d        Date:  08/04/19                 EDD:   05/10/20
 Best:          34w 2d     Det. By:  LMP  (08/04/19)          EDD:   05/10/20
Anatomy

 Stomach:               Appears normal, left   Bladder:                Appears normal
                        sided
 Abdomen:               Ascites
Doppler - Fetal Vessels

 Umbilical Artery
  S/D     %tile      RI    %tile      PI    %tile     PSV    ADFV    RDFV
                                                    (cm/s)
  5.31   > 97.5    0.81   > 97.5    1.52   > 97.5    44.99      No      No

Comments

 This patient was seen for a biophysical profile due to fetal
 abdominal ascites and cardiac defect.  She denies any
 problems since her last exam and reports feeling fetal
 movements throughout the day.
 A biophysical profile performed today was [DATE].
 There was normal amniotic fluid noted on today's ultrasound
 exam.  The fetal abdominal ascites continues to be noted.  It
 appears stable as compared to her prior exams.
 Doppler studies of the umbilical arteries performed today
 continues to show normal forward flow with an elevated S/D
 ratio of 5.31.  There were no signs of absent or reversed end-
 diastolic flow noted today.
 She will continue twice weekly fetal testing, once in our office
 and once at [HOSPITAL] [facility].
 She already has a cesarean delivery scheduled on
 April 10, 2020.
 A final biophysical profile was scheduled in our office in 1
 week.

## 2021-11-04 LAB — OB RESULTS CONSOLE RPR: RPR: NONREACTIVE

## 2021-11-04 LAB — OB RESULTS CONSOLE HIV ANTIBODY (ROUTINE TESTING): HIV: NONREACTIVE

## 2021-11-04 LAB — OB RESULTS CONSOLE ABO/RH: RH Type: POSITIVE

## 2021-11-04 LAB — OB RESULTS CONSOLE RUBELLA ANTIBODY, IGM: Rubella: IMMUNE

## 2021-11-04 LAB — OB RESULTS CONSOLE ANTIBODY SCREEN: Antibody Screen: NEGATIVE

## 2021-11-04 LAB — HEPATITIS C ANTIBODY: HCV Ab: NEGATIVE

## 2021-11-04 LAB — OB RESULTS CONSOLE GC/CHLAMYDIA
Chlamydia: NEGATIVE
Neisseria Gonorrhea: NEGATIVE

## 2021-11-04 LAB — OB RESULTS CONSOLE HEPATITIS B SURFACE ANTIGEN: Hepatitis B Surface Ag: NEGATIVE

## 2022-01-11 NOTE — Telephone Encounter (Signed)
Close encounter 

## 2022-02-23 ENCOUNTER — Other Ambulatory Visit: Payer: Self-pay | Admitting: Obstetrics and Gynecology

## 2022-02-23 DIAGNOSIS — Z3689 Encounter for other specified antenatal screening: Secondary | ICD-10-CM

## 2022-03-01 LAB — OB RESULTS CONSOLE RPR: RPR: NONREACTIVE

## 2022-03-03 ENCOUNTER — Telehealth: Payer: Self-pay

## 2022-03-04 ENCOUNTER — Ambulatory Visit: Payer: 59 | Attending: Obstetrics and Gynecology | Admitting: *Deleted

## 2022-03-04 ENCOUNTER — Ambulatory Visit (HOSPITAL_BASED_OUTPATIENT_CLINIC_OR_DEPARTMENT_OTHER): Payer: 59

## 2022-03-04 ENCOUNTER — Other Ambulatory Visit: Payer: Self-pay | Admitting: *Deleted

## 2022-03-04 VITALS — BP 129/85 | HR 91

## 2022-03-04 DIAGNOSIS — Z3689 Encounter for other specified antenatal screening: Secondary | ICD-10-CM

## 2022-03-04 DIAGNOSIS — O34219 Maternal care for unspecified type scar from previous cesarean delivery: Secondary | ICD-10-CM | POA: Diagnosis not present

## 2022-03-04 DIAGNOSIS — O09293 Supervision of pregnancy with other poor reproductive or obstetric history, third trimester: Secondary | ICD-10-CM | POA: Insufficient documentation

## 2022-03-04 DIAGNOSIS — O99213 Obesity complicating pregnancy, third trimester: Secondary | ICD-10-CM

## 2022-03-04 DIAGNOSIS — O09813 Supervision of pregnancy resulting from assisted reproductive technology, third trimester: Secondary | ICD-10-CM

## 2022-03-04 DIAGNOSIS — O09523 Supervision of elderly multigravida, third trimester: Secondary | ICD-10-CM

## 2022-03-04 DIAGNOSIS — Z363 Encounter for antenatal screening for malformations: Secondary | ICD-10-CM | POA: Diagnosis present

## 2022-03-05 ENCOUNTER — Ambulatory Visit: Payer: 59

## 2022-03-05 ENCOUNTER — Other Ambulatory Visit: Payer: Self-pay

## 2022-04-01 ENCOUNTER — Other Ambulatory Visit: Payer: Self-pay | Admitting: *Deleted

## 2022-04-01 ENCOUNTER — Ambulatory Visit: Payer: 59 | Admitting: *Deleted

## 2022-04-01 ENCOUNTER — Ambulatory Visit: Payer: 59 | Attending: Obstetrics

## 2022-04-01 VITALS — BP 138/68 | HR 99

## 2022-04-01 DIAGNOSIS — O09813 Supervision of pregnancy resulting from assisted reproductive technology, third trimester: Secondary | ICD-10-CM | POA: Insufficient documentation

## 2022-04-01 DIAGNOSIS — O09213 Supervision of pregnancy with history of pre-term labor, third trimester: Secondary | ICD-10-CM | POA: Diagnosis not present

## 2022-04-01 DIAGNOSIS — Z3A32 32 weeks gestation of pregnancy: Secondary | ICD-10-CM

## 2022-04-01 DIAGNOSIS — Z3689 Encounter for other specified antenatal screening: Secondary | ICD-10-CM | POA: Insufficient documentation

## 2022-04-01 DIAGNOSIS — O99213 Obesity complicating pregnancy, third trimester: Secondary | ICD-10-CM | POA: Diagnosis present

## 2022-04-01 DIAGNOSIS — O09523 Supervision of elderly multigravida, third trimester: Secondary | ICD-10-CM | POA: Insufficient documentation

## 2022-04-01 DIAGNOSIS — O34219 Maternal care for unspecified type scar from previous cesarean delivery: Secondary | ICD-10-CM | POA: Diagnosis not present

## 2022-04-30 ENCOUNTER — Ambulatory Visit: Payer: 59 | Attending: Maternal & Fetal Medicine

## 2022-04-30 ENCOUNTER — Ambulatory Visit: Payer: 59 | Attending: Maternal & Fetal Medicine | Admitting: *Deleted

## 2022-04-30 VITALS — BP 134/82 | HR 88

## 2022-04-30 DIAGNOSIS — O09523 Supervision of elderly multigravida, third trimester: Secondary | ICD-10-CM | POA: Diagnosis not present

## 2022-04-30 DIAGNOSIS — O34219 Maternal care for unspecified type scar from previous cesarean delivery: Secondary | ICD-10-CM | POA: Diagnosis not present

## 2022-04-30 DIAGNOSIS — O09293 Supervision of pregnancy with other poor reproductive or obstetric history, third trimester: Secondary | ICD-10-CM | POA: Insufficient documentation

## 2022-04-30 DIAGNOSIS — O09819 Supervision of pregnancy resulting from assisted reproductive technology, unspecified trimester: Secondary | ICD-10-CM | POA: Insufficient documentation

## 2022-04-30 DIAGNOSIS — O26893 Other specified pregnancy related conditions, third trimester: Secondary | ICD-10-CM | POA: Diagnosis not present

## 2022-04-30 DIAGNOSIS — Z3A36 36 weeks gestation of pregnancy: Secondary | ICD-10-CM | POA: Insufficient documentation

## 2022-04-30 DIAGNOSIS — Z362 Encounter for other antenatal screening follow-up: Secondary | ICD-10-CM | POA: Insufficient documentation

## 2022-04-30 DIAGNOSIS — O09213 Supervision of pregnancy with history of pre-term labor, third trimester: Secondary | ICD-10-CM | POA: Diagnosis not present

## 2022-04-30 DIAGNOSIS — O09813 Supervision of pregnancy resulting from assisted reproductive technology, third trimester: Secondary | ICD-10-CM

## 2022-05-03 ENCOUNTER — Inpatient Hospital Stay (HOSPITAL_BASED_OUTPATIENT_CLINIC_OR_DEPARTMENT_OTHER): Payer: 59

## 2022-05-03 ENCOUNTER — Encounter (HOSPITAL_COMMUNITY): Payer: Self-pay | Admitting: Obstetrics and Gynecology

## 2022-05-03 ENCOUNTER — Inpatient Hospital Stay (HOSPITAL_COMMUNITY)
Admission: AD | Admit: 2022-05-03 | Discharge: 2022-05-03 | Disposition: A | Discharge status: Home / Self Care | Payer: 59 | Attending: Obstetrics and Gynecology | Admitting: Obstetrics and Gynecology

## 2022-05-03 DIAGNOSIS — O133 Gestational [pregnancy-induced] hypertension without significant proteinuria, third trimester: Secondary | ICD-10-CM

## 2022-05-03 DIAGNOSIS — O471 False labor at or after 37 completed weeks of gestation: Secondary | ICD-10-CM | POA: Insufficient documentation

## 2022-05-03 DIAGNOSIS — O368130 Decreased fetal movements, third trimester, not applicable or unspecified: Secondary | ICD-10-CM

## 2022-05-03 DIAGNOSIS — Z3A37 37 weeks gestation of pregnancy: Secondary | ICD-10-CM | POA: Insufficient documentation

## 2022-05-03 LAB — CBC
HCT: 33.5 % — ABNORMAL LOW (ref 36.0–46.0)
Hemoglobin: 11.5 g/dL — ABNORMAL LOW (ref 12.0–15.0)
MCH: 29.1 pg (ref 26.0–34.0)
MCHC: 34.3 g/dL (ref 30.0–36.0)
MCV: 84.8 fL (ref 80.0–100.0)
Platelets: 236 10*3/uL (ref 150–400)
RBC: 3.95 MIL/uL (ref 3.87–5.11)
RDW: 13.3 % (ref 11.5–15.5)
WBC: 7 10*3/uL (ref 4.0–10.5)
nRBC: 0 % (ref 0.0–0.2)

## 2022-05-03 LAB — PROTEIN / CREATININE RATIO, URINE
Creatinine, Urine: 97 mg/dL
Protein Creatinine Ratio: 0.12 mg/mg{Cre} (ref 0.00–0.15)
Total Protein, Urine: 12 mg/dL

## 2022-05-03 LAB — COMPREHENSIVE METABOLIC PANEL
ALT: 12 U/L (ref 0–44)
AST: 17 U/L (ref 15–41)
Albumin: 2.8 g/dL — ABNORMAL LOW (ref 3.5–5.0)
Alkaline Phosphatase: 90 U/L (ref 38–126)
Anion gap: 8 (ref 5–15)
BUN: 7 mg/dL (ref 6–20)
CO2: 21 mmol/L — ABNORMAL LOW (ref 22–32)
Calcium: 9.2 mg/dL (ref 8.9–10.3)
Chloride: 105 mmol/L (ref 98–111)
Creatinine, Ser: 0.68 mg/dL (ref 0.44–1.00)
GFR, Estimated: >60 mL/min (ref >60)
Glucose, Bld: 84 mg/dL (ref 70–99)
Potassium: 3.8 mmol/L (ref 3.5–5.1)
Sodium: 134 mmol/L — ABNORMAL LOW (ref 135–145)
Total Bilirubin: 0.5 mg/dL (ref 0.3–1.2)
Total Protein: 6.3 g/dL — ABNORMAL LOW (ref 6.5–8.1)

## 2022-05-03 NOTE — MAU Note (Signed)
Mikayla Becker is a 36 y.o. at [redacted]w[redacted]d here in MAU reporting: baby is usually just rolling in there, today the baby is moving, but nothing like usual.  Denies pain, bleeding, or LOF. Wasn't feeling good this morning, just not right. BP has been elevated.  Denies HA, or epigastric pain, saw a few spots while in the shower, did not last long, feet and hands have been swollen off and on.   Onset of complaint: today Pain score: none Vitals:   05/03/22 1747  BP: (!) 147/96  Pulse: (!) 105  Resp: 18  Temp: 98 F (36.7 C)  SpO2: 99%     FHT:160 Lab orders placed from triage:  urine

## 2022-05-03 NOTE — Discharge Instructions (Signed)
Labor precautions, daily kick ct 

## 2022-05-03 NOTE — MAU Provider Note (Signed)
History     Chief Complaint  Patient presents with   Decreased Fetal Movement   36 yo [id number] MWF @ 37 2/[redacted] wk gestations presents with c/o decreased fetal movement.  BP elevation noted. 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, cardiac and pulm problems, lived 2 wks PP Pre-E with first and 2nd         Past Medical History:  Diagnosis Date   Anxiety    GERD (gastroesophageal reflux disease)    Hypertension    Preterm labor     Past Surgical History:  Procedure Laterality Date   CESAREAN SECTION     MOUTH SURGERY     root canal   WISDOM TOOTH EXTRACTION Bilateral     Family History  Problem Relation Age of Onset   Hypertension Mother    Hypertension Father    Birth defects Son    Heart disease Son    Pulmonary Hypertension Son    Diabetes Maternal Uncle    Stroke Maternal Grandmother    Diabetes Maternal Grandmother    Cancer Neg Hx     Social History   Tobacco Use   Smoking status: Never   Smokeless tobacco: Never  Vaping Use   Vaping Use: Never used  Substance Use Topics   Alcohol use: Not Currently   Drug use: No    Allergies: No Known Allergies  Medications Prior to Admission  Medication Sig Dispense Refill Last Dose   ASPIRIN 81 PO Take by mouth.   05/03/2022   calcium carbonate (TUMS - DOSED IN MG ELEMENTAL CALCIUM) 500 MG chewable tablet Chew 1 tablet by mouth 3 (three) times daily as needed for indigestion or heartburn.   05/03/2022   FERROUS SULFATE PO Take by mouth.   05/03/2022   Prenatal Vit-Fe Fumarate-FA (PRENATAL MULTIVITAMIN) TABS tablet Take 1 tablet by mouth daily at 12 noon.   05/02/2022   sertraline (ZOLOFT) 50 MG tablet Take 50 mg by mouth daily.    05/03/2022     Physical Exam   Blood pressure (!) 147/96, pulse (!) 105, temperature 98 F (36.7 C), temperature source Oral, resp. rate 18, height 5\' 7"  (1.702 m), weight 90.7 kg, last menstrual period 08/15/2021, SpO2 99 %, unknown if currently breastfeeding.  General appearance: alert, cooperative, and no distress Lungs: clear to auscultation bilaterally Heart: regular rate and rhythm, S1, S2 normal, no murmur, click, rub or gallop Abdomen:  gravid soft nontender Extremities: extremities normal, atraumatic, no cyanosis or edema and no edema, redness or tenderness in the calves or thighs Pelvic: closed/50/-2  Tracing: baseline 160(+) accel tp 180 Ctx q 2 mins  ED Course  IMP:  DFM Premature contractions Gestational HTN Previous C/S IUP @ 37 2/7 wk P) PIH labs. BPP MDM Addendum    Latest Ref Rng & Units 05/03/2022    6:34 PM 04/27/2016   10:05 PM  CBC  WBC 4.0 - 10.5 K/uL 7.0  11.2   Hemoglobin 12.0 -  15.0 g/dL 74.2  59.5   Hematocrit 36.0 - 46.0 % 33.5  32.0   Platelets 150 - 400 K/uL 236  244        Latest Ref Rng & Units 05/03/2022    6:34 PM 04/27/2016   10:05 PM  CMP  Glucose 70 - 99 mg/dL 84  99   BUN 6 - 20 mg/dL 7  8   Creatinine 6.38 - 1.00 mg/dL 7.56  4.33   Sodium 295 - 145 mmol/L 134  135   Potassium 3.5 - 5.1 mmol/L 3.8  3.7   Chloride 98 - 111 mmol/L 105  107   CO2 22 - 32 mmol/L 21  21   Calcium 8.9 - 10.3 mg/dL 9.2  8.9   Total Protein 6.5 - 8.1 g/dL 6.3  6.0   Total Bilirubin 0.3 - 1.2 mg/dL 0.5  0.4   Alkaline Phos 38 - 126 U/L 90  121   AST 15 - 41 U/L 17  15   ALT 0 - 44 U/L 12  11    PCR    0.12  BPP 8/8 reassuring fetal testing. Nl fluid  Reassuring fetal testing.  No evidence of preeclampsia D/c home. Cancel appt for tomorrow.  Serita Kyle, MD 6:32 PM 05/03/2022

## 2022-05-05 NOTE — Patient Instructions (Addendum)
Mikayla Becker  05/05/2022   Your procedure is scheduled on:  05/15/2022  Arrive at [facility] at Entrance C on [address] [facility]  and [address]. You are invited to use the FREE valet parking or use the Visitor's parking deck.  Pick up the phone at the desk and dial [phone].  Call this number if you have problems the morning of surgery: [phone]  Remember:   Do not eat food:(After Midnight) Desps de medianoche.  Do not drink clear liquids: (After Midnight) Desps de medianoche.  Take these medicines the morning of surgery with A SIP OF WATER:   zoloft   Do not wear jewelry, make-up or nail polish.  Do not wear lotions, powders, or perfumes. Do not wear deodorant.  Do not shave 48 hours prior to surgery.  Do not bring valuables to the hospital.  [facility] is not   responsible for any belongings or valuables brought to the hospital.  Contacts, dentures or bridgework may not be worn into surgery.  Leave suitcase in the car. After surgery it may be brought to your room.  For patients admitted to the hospital, checkout time is 11:00 AM the day of              discharge.      Please read over the following fact sheets that you were given:     Preparing for Surgery

## 2022-05-06 ENCOUNTER — Telehealth (HOSPITAL_COMMUNITY): Payer: Self-pay | Admitting: *Deleted

## 2022-05-06 ENCOUNTER — Ambulatory Visit: Payer: 59 | Attending: Maternal & Fetal Medicine

## 2022-05-06 ENCOUNTER — Ambulatory Visit: Payer: 59 | Admitting: *Deleted

## 2022-05-06 ENCOUNTER — Encounter (HOSPITAL_COMMUNITY): Payer: Self-pay

## 2022-05-06 VITALS — BP 141/72 | HR 98

## 2022-05-06 DIAGNOSIS — O09523 Supervision of elderly multigravida, third trimester: Secondary | ICD-10-CM | POA: Diagnosis present

## 2022-05-06 DIAGNOSIS — O34219 Maternal care for unspecified type scar from previous cesarean delivery: Secondary | ICD-10-CM | POA: Diagnosis not present

## 2022-05-06 DIAGNOSIS — O09813 Supervision of pregnancy resulting from assisted reproductive technology, third trimester: Secondary | ICD-10-CM

## 2022-05-06 DIAGNOSIS — Z369 Encounter for antenatal screening, unspecified: Secondary | ICD-10-CM | POA: Insufficient documentation

## 2022-05-06 DIAGNOSIS — Z3A37 37 weeks gestation of pregnancy: Secondary | ICD-10-CM | POA: Insufficient documentation

## 2022-05-06 DIAGNOSIS — O09293 Supervision of pregnancy with other poor reproductive or obstetric history, third trimester: Secondary | ICD-10-CM | POA: Diagnosis not present

## 2022-05-06 DIAGNOSIS — O09213 Supervision of pregnancy with history of pre-term labor, third trimester: Secondary | ICD-10-CM | POA: Diagnosis not present

## 2022-05-06 NOTE — Telephone Encounter (Signed)
Preadmission screen  

## 2022-05-07 ENCOUNTER — Encounter (HOSPITAL_COMMUNITY): Payer: Self-pay

## 2022-05-12 NOTE — Anesthesia Preprocedure Evaluation (Addendum)
Anesthesia Evaluation   Patient awake    Reviewed: Allergy & Precautions, NPO status , Patient's Chart, lab work & pertinent test results  History of Anesthesia Complications (+) PONV and history of anesthetic complications  Airway Mallampati: II  TM Distance: >3 FB Neck ROM: Full    Dental no notable dental hx.    Pulmonary neg pulmonary ROS,    Pulmonary exam normal        Cardiovascular hypertension, Normal cardiovascular exam     Neuro/Psych Anxiety negative neurological ROS     GI/Hepatic Neg liver ROS, GERD  Controlled,  Endo/Other  negative endocrine ROS  Renal/GU negative Renal ROS  negative genitourinary   Musculoskeletal negative musculoskeletal ROS (+)   Abdominal   Peds  Hematology  (+) Blood dyscrasia (Hgb 11.4), anemia ,   Anesthesia Other Findings Day of surgery medications reviewed with patient.  Reproductive/Obstetrics (+) Pregnancy (Hx of C/S x2)                           Anesthesia Physical Anesthesia Plan  ASA: 2  Anesthesia Plan: Spinal   Post-op Pain Management:    Induction:   PONV Risk Score and Plan: 4 or greater and Treatment may vary due to age or medical condition, Ondansetron, Dexamethasone and Scopolamine patch - Pre-op  Airway Management Planned: Natural Airway  Additional Equipment: None  Intra-op Plan:   Post-operative Plan:   Informed Consent: I have reviewed the patients History and Physical, chart, labs and discussed the procedure including the risks, benefits and alternatives for the proposed anesthesia with the patient or authorized representative who has indicated his/her understanding and acceptance.       Plan Discussed with: CRNA  Anesthesia Plan Comments:        Anesthesia Quick Evaluation

## 2022-05-13 ENCOUNTER — Encounter (HOSPITAL_COMMUNITY)
Admission: RE | Admit: 2022-05-13 | Discharge: 2022-05-13 | Disposition: A | Discharge status: Home / Self Care | Payer: 59 | Source: Ambulatory Visit | Attending: Obstetrics and Gynecology | Admitting: Obstetrics and Gynecology

## 2022-05-13 DIAGNOSIS — Z01812 Encounter for preprocedural laboratory examination: Secondary | ICD-10-CM | POA: Insufficient documentation

## 2022-05-13 HISTORY — DX: Personal history of other diseases of the circulatory system: Z86.79

## 2022-05-13 HISTORY — DX: Nausea with vomiting, unspecified: R11.2

## 2022-05-13 HISTORY — DX: Other specified postprocedural states: Z98.890

## 2022-05-13 HISTORY — DX: Personal history of other complications of pregnancy, childbirth and the puerperium: Z87.59

## 2022-05-13 LAB — CBC
HCT: 34 % — ABNORMAL LOW (ref 36.0–46.0)
Hemoglobin: 11.4 g/dL — ABNORMAL LOW (ref 12.0–15.0)
MCH: 28.9 pg (ref 26.0–34.0)
MCHC: 33.5 g/dL (ref 30.0–36.0)
MCV: 86.1 fL (ref 80.0–100.0)
Platelets: 221 10*3/uL (ref 150–400)
RBC: 3.95 MIL/uL (ref 3.87–5.11)
RDW: 13.4 % (ref 11.5–15.5)
WBC: 7 10*3/uL (ref 4.0–10.5)
nRBC: 0 % (ref 0.0–0.2)

## 2022-05-13 LAB — TYPE AND SCREEN
ABO/RH(D): AB POS
Antibody Screen: NEGATIVE

## 2022-05-14 LAB — RPR: RPR Ser Ql: NONREACTIVE

## 2022-05-15 ENCOUNTER — Encounter (HOSPITAL_COMMUNITY): Payer: Self-pay | Admitting: Obstetrics and Gynecology

## 2022-05-15 ENCOUNTER — Other Ambulatory Visit: Payer: Self-pay

## 2022-05-15 ENCOUNTER — Inpatient Hospital Stay (HOSPITAL_COMMUNITY): Payer: 59 | Admitting: Anesthesiology

## 2022-05-15 ENCOUNTER — Encounter (HOSPITAL_COMMUNITY)
Admission: RE | Disposition: A | Discharge status: Home / Self Care | Payer: Self-pay | Source: Home / Self Care | Attending: Obstetrics and Gynecology

## 2022-05-15 ENCOUNTER — Inpatient Hospital Stay (HOSPITAL_COMMUNITY)
Admission: RE | Admit: 2022-05-15 | Discharge: 2022-05-18 | DRG: 784 | Disposition: A | Discharge status: Home / Self Care | Payer: 59 | Attending: Obstetrics and Gynecology | Admitting: Obstetrics and Gynecology

## 2022-05-15 DIAGNOSIS — O34219 Maternal care for unspecified type scar from previous cesarean delivery: Secondary | ICD-10-CM | POA: Diagnosis present

## 2022-05-15 DIAGNOSIS — Z23 Encounter for immunization: Secondary | ICD-10-CM

## 2022-05-15 DIAGNOSIS — Z302 Encounter for sterilization: Secondary | ICD-10-CM

## 2022-05-15 DIAGNOSIS — O99354 Diseases of the nervous system complicating childbirth: Secondary | ICD-10-CM | POA: Diagnosis present

## 2022-05-15 DIAGNOSIS — Z3A39 39 weeks gestation of pregnancy: Secondary | ICD-10-CM

## 2022-05-15 DIAGNOSIS — O134 Gestational [pregnancy-induced] hypertension without significant proteinuria, complicating childbirth: Secondary | ICD-10-CM | POA: Diagnosis present

## 2022-05-15 DIAGNOSIS — F32A Depression, unspecified: Secondary | ICD-10-CM | POA: Diagnosis present

## 2022-05-15 DIAGNOSIS — O9902 Anemia complicating childbirth: Secondary | ICD-10-CM | POA: Diagnosis present

## 2022-05-15 DIAGNOSIS — O99344 Other mental disorders complicating childbirth: Secondary | ICD-10-CM | POA: Diagnosis present

## 2022-05-15 DIAGNOSIS — G43909 Migraine, unspecified, not intractable, without status migrainosus: Secondary | ICD-10-CM | POA: Diagnosis present

## 2022-05-15 DIAGNOSIS — Z01812 Encounter for preprocedural laboratory examination: Principal | ICD-10-CM

## 2022-05-15 DIAGNOSIS — F419 Anxiety disorder, unspecified: Secondary | ICD-10-CM | POA: Diagnosis present

## 2022-05-15 DIAGNOSIS — O34211 Maternal care for low transverse scar from previous cesarean delivery: Principal | ICD-10-CM | POA: Diagnosis present

## 2022-05-15 HISTORY — PX: TUBAL LIGATION: SHX77

## 2022-05-15 LAB — COMPREHENSIVE METABOLIC PANEL
ALT: 11 U/L (ref 0–44)
AST: 17 U/L (ref 15–41)
Albumin: 2.3 g/dL — ABNORMAL LOW (ref 3.5–5.0)
Alkaline Phosphatase: 76 U/L (ref 38–126)
Anion gap: 9 (ref 5–15)
BUN: 7 mg/dL (ref 6–20)
CO2: 21 mmol/L — ABNORMAL LOW (ref 22–32)
Calcium: 8 mg/dL — ABNORMAL LOW (ref 8.9–10.3)
Chloride: 107 mmol/L (ref 98–111)
Creatinine, Ser: 0.73 mg/dL (ref 0.44–1.00)
GFR, Estimated: >60 mL/min (ref >60)
Glucose, Bld: 96 mg/dL (ref 70–99)
Potassium: 4.3 mmol/L (ref 3.5–5.1)
Sodium: 137 mmol/L (ref 135–145)
Total Bilirubin: 0.3 mg/dL (ref 0.3–1.2)
Total Protein: 5.1 g/dL — ABNORMAL LOW (ref 6.5–8.1)

## 2022-05-15 LAB — PROTEIN / CREATININE RATIO, URINE
Creatinine, Urine: 70 mg/dL
Protein Creatinine Ratio: 0.27 mg/mg{Cre} — ABNORMAL HIGH (ref 0.00–0.15)
Total Protein, Urine: 19 mg/dL

## 2022-05-15 LAB — CBC
HCT: 30.4 % — ABNORMAL LOW (ref 36.0–46.0)
Hemoglobin: 10.2 g/dL — ABNORMAL LOW (ref 12.0–15.0)
MCH: 28.8 pg (ref 26.0–34.0)
MCHC: 33.6 g/dL (ref 30.0–36.0)
MCV: 85.9 fL (ref 80.0–100.0)
Platelets: 195 10*3/uL (ref 150–400)
RBC: 3.54 MIL/uL — ABNORMAL LOW (ref 3.87–5.11)
RDW: 13.3 % (ref 11.5–15.5)
WBC: 8.5 10*3/uL (ref 4.0–10.5)
nRBC: 0 % (ref 0.0–0.2)

## 2022-05-15 LAB — ABO/RH: ABO/RH(D): AB POS

## 2022-05-15 SURGERY — Surgical Case
Anesthesia: Spinal

## 2022-05-15 MED ORDER — MORPHINE SULFATE (PF) 0.5 MG/ML IJ SOLN
INTRAMUSCULAR | Status: AC
  Filled 2022-05-15: qty 10

## 2022-05-15 MED ORDER — POVIDONE-IODINE 10 % EX SWAB
2.0000 | Freq: Once | CUTANEOUS | Status: AC
  Administered 2022-05-15: 2 via TOPICAL

## 2022-05-15 MED ORDER — ONDANSETRON HCL 4 MG/2ML IJ SOLN
INTRAMUSCULAR | Status: AC
  Filled 2022-05-15: qty 2

## 2022-05-15 MED ORDER — SODIUM CHLORIDE 0.9% FLUSH: 3.0000 mL | INTRAVENOUS | Status: DC | PRN

## 2022-05-15 MED ORDER — DIPHENHYDRAMINE HCL 50 MG/ML IJ SOLN: 12.5000 mg | INTRAMUSCULAR | Status: DC | PRN

## 2022-05-15 MED ORDER — MENTHOL 3 MG MT LOZG: 1.0000 | LOZENGE | OROMUCOSAL | Status: DC | PRN

## 2022-05-15 MED ORDER — OXYTOCIN-SODIUM CHLORIDE 30-0.9 UT/500ML-% IV SOLN
INTRAVENOUS | Status: AC
  Filled 2022-05-15: qty 500

## 2022-05-15 MED ORDER — ACETAMINOPHEN 10 MG/ML IV SOLN
INTRAVENOUS | Status: DC | PRN
  Administered 2022-05-15: 1000 mg via INTRAVENOUS

## 2022-05-15 MED ORDER — PRENATAL MULTIVITAMIN CH
1.0000 | ORAL_TABLET | Freq: Every day | ORAL | Status: DC
  Administered 2022-05-16 – 2022-05-18 (×3): 1 via ORAL
  Filled 2022-05-15 (×3): qty 1

## 2022-05-15 MED ORDER — KETOROLAC TROMETHAMINE 30 MG/ML IJ SOLN: 30.0000 mg | Freq: Four times a day (QID) | INTRAMUSCULAR | Status: AC | PRN

## 2022-05-15 MED ORDER — SERTRALINE HCL 50 MG PO TABS
50.0000 mg | ORAL_TABLET | Freq: Every day | ORAL | Status: DC
  Administered 2022-05-16 – 2022-05-18 (×3): 50 mg via ORAL
  Filled 2022-05-15 (×3): qty 1

## 2022-05-15 MED ORDER — WITCH HAZEL-GLYCERIN EX PADS: 1.0000 | MEDICATED_PAD | CUTANEOUS | Status: DC | PRN

## 2022-05-15 MED ORDER — PHENYLEPHRINE HCL-NACL 20-0.9 MG/250ML-% IV SOLN
INTRAVENOUS | Status: AC
  Filled 2022-05-15: qty 250

## 2022-05-15 MED ORDER — FENTANYL CITRATE (PF) 100 MCG/2ML IJ SOLN: 25.0000 ug | INTRAMUSCULAR | Status: DC | PRN

## 2022-05-15 MED ORDER — BUPIVACAINE HCL (PF) 0.25 % IJ SOLN
INTRAMUSCULAR | Status: AC
  Filled 2022-05-15: qty 20

## 2022-05-15 MED ORDER — DIPHENHYDRAMINE HCL 25 MG PO CAPS: 25.0000 mg | ORAL_CAPSULE | ORAL | Status: DC | PRN

## 2022-05-15 MED ORDER — SOD CITRATE-CITRIC ACID 500-334 MG/5ML PO SOLN
30.0000 mL | ORAL | Status: AC
  Administered 2022-05-15: 30 mL via ORAL

## 2022-05-15 MED ORDER — ZOLPIDEM TARTRATE 5 MG PO TABS
5.0000 mg | ORAL_TABLET | Freq: Every evening | ORAL | Status: DC | PRN
  Administered 2022-05-16 – 2022-05-17 (×2): 5 mg via ORAL
  Filled 2022-05-15 (×2): qty 1

## 2022-05-15 MED ORDER — SIMETHICONE 80 MG PO CHEW
80.0000 mg | CHEWABLE_TABLET | Freq: Three times a day (TID) | ORAL | Status: DC
  Administered 2022-05-15 – 2022-05-18 (×6): 80 mg via ORAL
  Filled 2022-05-15 (×7): qty 1

## 2022-05-15 MED ORDER — ACETAMINOPHEN 160 MG/5ML PO SOLN: 1000.0000 mg | Freq: Once | ORAL | Status: DC

## 2022-05-15 MED ORDER — ACETAMINOPHEN 500 MG PO TABS: 1000.0000 mg | ORAL_TABLET | Freq: Once | ORAL | Status: DC

## 2022-05-15 MED ORDER — DIBUCAINE (PERIANAL) 1 % EX OINT: 1.0000 | TOPICAL_OINTMENT | CUTANEOUS | Status: DC | PRN

## 2022-05-15 MED ORDER — OXYTOCIN-SODIUM CHLORIDE 30-0.9 UT/500ML-% IV SOLN
INTRAVENOUS | Status: DC | PRN
  Administered 2022-05-15: 200 mL via INTRAVENOUS
  Administered 2022-05-15: 300 mL via INTRAVENOUS

## 2022-05-15 MED ORDER — ACETAMINOPHEN 500 MG PO TABS
1000.0000 mg | ORAL_TABLET | Freq: Four times a day (QID) | ORAL | Status: DC
  Administered 2022-05-15 – 2022-05-18 (×11): 1000 mg via ORAL
  Filled 2022-05-15 (×12): qty 2

## 2022-05-15 MED ORDER — ACETAMINOPHEN 500 MG PO TABS: 1000.0000 mg | ORAL_TABLET | Freq: Four times a day (QID) | ORAL | Status: DC

## 2022-05-15 MED ORDER — PHENYLEPHRINE HCL-NACL 20-0.9 MG/250ML-% IV SOLN
INTRAVENOUS | Status: DC | PRN
  Administered 2022-05-15: 60 ug/min via INTRAVENOUS

## 2022-05-15 MED ORDER — DEXMEDETOMIDINE HCL IN NACL 80 MCG/20ML IV SOLN
INTRAVENOUS | Status: AC
  Filled 2022-05-15: qty 20

## 2022-05-15 MED ORDER — CEFAZOLIN SODIUM-DEXTROSE 2-4 GM/100ML-% IV SOLN
2.0000 g | INTRAVENOUS | Status: AC
  Administered 2022-05-15: 2 g via INTRAVENOUS

## 2022-05-15 MED ORDER — DEXAMETHASONE SODIUM PHOSPHATE 10 MG/ML IJ SOLN
INTRAMUSCULAR | Status: AC
  Filled 2022-05-15: qty 1

## 2022-05-15 MED ORDER — DEXAMETHASONE SODIUM PHOSPHATE 10 MG/ML IJ SOLN
INTRAMUSCULAR | Status: DC | PRN
  Administered 2022-05-15: 10 mg via INTRAVENOUS

## 2022-05-15 MED ORDER — ONDANSETRON HCL 4 MG/2ML IJ SOLN
INTRAMUSCULAR | Status: DC | PRN
  Administered 2022-05-15: 4 mg via INTRAVENOUS

## 2022-05-15 MED ORDER — SOD CITRATE-CITRIC ACID 500-334 MG/5ML PO SOLN
ORAL | Status: AC
  Filled 2022-05-15: qty 30

## 2022-05-15 MED ORDER — COCONUT OIL OIL
1.0000 | TOPICAL_OIL | Status: DC | PRN
  Administered 2022-05-18: 1 via TOPICAL

## 2022-05-15 MED ORDER — KETOROLAC TROMETHAMINE 30 MG/ML IJ SOLN
INTRAMUSCULAR | Status: AC
  Filled 2022-05-15: qty 1

## 2022-05-15 MED ORDER — BUPIVACAINE HCL (PF) 0.25 % IJ SOLN
INTRAMUSCULAR | Status: AC
  Filled 2022-05-15: qty 10

## 2022-05-15 MED ORDER — SIMETHICONE 80 MG PO CHEW
80.0000 mg | CHEWABLE_TABLET | ORAL | Status: DC | PRN
  Administered 2022-05-16: 80 mg via ORAL
  Filled 2022-05-15: qty 1

## 2022-05-15 MED ORDER — STERILE WATER FOR IRRIGATION IR SOLN
Status: DC | PRN
  Administered 2022-05-15: 1

## 2022-05-15 MED ORDER — DIPHENHYDRAMINE HCL 25 MG PO CAPS: 25.0000 mg | ORAL_CAPSULE | Freq: Four times a day (QID) | ORAL | Status: DC | PRN

## 2022-05-15 MED ORDER — DEXMEDETOMIDINE HCL IN NACL 200 MCG/50ML IV SOLN
INTRAVENOUS | Status: DC | PRN
  Administered 2022-05-15 (×2): 4 ug via INTRAVENOUS

## 2022-05-15 MED ORDER — SENNOSIDES-DOCUSATE SODIUM 8.6-50 MG PO TABS
2.0000 | ORAL_TABLET | ORAL | Status: DC
  Administered 2022-05-16 – 2022-05-18 (×3): 2 via ORAL
  Filled 2022-05-15 (×3): qty 2

## 2022-05-15 MED ORDER — SODIUM CHLORIDE 0.9 % IR SOLN
Status: DC | PRN
  Administered 2022-05-15: 1

## 2022-05-15 MED ORDER — CEFAZOLIN SODIUM-DEXTROSE 2-4 GM/100ML-% IV SOLN
INTRAVENOUS | Status: AC
  Filled 2022-05-15: qty 100

## 2022-05-15 MED ORDER — FENTANYL CITRATE (PF) 100 MCG/2ML IJ SOLN
INTRAMUSCULAR | Status: DC | PRN
  Administered 2022-05-15: 15 ug via INTRATHECAL

## 2022-05-15 MED ORDER — ACETAMINOPHEN 10 MG/ML IV SOLN
INTRAVENOUS | Status: AC
  Filled 2022-05-15: qty 100

## 2022-05-15 MED ORDER — SERTRALINE HCL 50 MG PO TABS: 50.0000 mg | ORAL_TABLET | ORAL | Status: DC

## 2022-05-15 MED ORDER — ONDANSETRON HCL 4 MG/2ML IJ SOLN: 4.0000 mg | Freq: Three times a day (TID) | INTRAMUSCULAR | Status: DC | PRN

## 2022-05-15 MED ORDER — FENTANYL CITRATE (PF) 100 MCG/2ML IJ SOLN
INTRAMUSCULAR | Status: AC
  Filled 2022-05-15: qty 2

## 2022-05-15 MED ORDER — NALOXONE HCL 4 MG/10ML IJ SOLN: 1.0000 ug/kg/h | INTRAVENOUS | Status: DC | PRN

## 2022-05-15 MED ORDER — MORPHINE SULFATE (PF) 0.5 MG/ML IJ SOLN
INTRAMUSCULAR | Status: DC | PRN
  Administered 2022-05-15: .15 mg via INTRATHECAL

## 2022-05-15 MED ORDER — NIFEDIPINE ER OSMOTIC RELEASE 30 MG PO TB24
30.0000 mg | ORAL_TABLET | Freq: Every day | ORAL | Status: DC
  Administered 2022-05-15 – 2022-05-16 (×2): 30 mg via ORAL
  Filled 2022-05-15 (×3): qty 1

## 2022-05-15 MED ORDER — DROPERIDOL 2.5 MG/ML IJ SOLN: 0.6250 mg | Freq: Once | INTRAMUSCULAR | Status: DC | PRN

## 2022-05-15 MED ORDER — BUPIVACAINE IN DEXTROSE 0.75-8.25 % IT SOLN
INTRATHECAL | Status: DC | PRN
  Administered 2022-05-15: 1.6 mg via INTRATHECAL

## 2022-05-15 MED ORDER — IBUPROFEN 600 MG PO TABS
600.0000 mg | ORAL_TABLET | Freq: Four times a day (QID) | ORAL | Status: AC
  Administered 2022-05-15 – 2022-05-18 (×12): 600 mg via ORAL
  Filled 2022-05-15 (×12): qty 1

## 2022-05-15 MED ORDER — KETOROLAC TROMETHAMINE 30 MG/ML IJ SOLN
30.0000 mg | Freq: Once | INTRAMUSCULAR | Status: AC
  Administered 2022-05-15: 30 mg via INTRAVENOUS

## 2022-05-15 MED ORDER — NALOXONE HCL 0.4 MG/ML IJ SOLN: 0.4000 mg | INTRAMUSCULAR | Status: DC | PRN

## 2022-05-15 MED ORDER — LACTATED RINGERS IV SOLN: INTRAVENOUS | Status: DC

## 2022-05-15 MED ORDER — OXYTOCIN-SODIUM CHLORIDE 30-0.9 UT/500ML-% IV SOLN
2.5000 [IU]/h | INTRAVENOUS | Status: AC
  Administered 2022-05-15: 2.5 [IU]/h via INTRAVENOUS
  Filled 2022-05-15: qty 500

## 2022-05-15 SURGICAL SUPPLY — 44 items
APL SKNCLS STERI-STRIP NONHPOA (GAUZE/BANDAGES/DRESSINGS) ×2
BARRIER ADHS 3X4 INTERCEED (GAUZE/BANDAGES/DRESSINGS) ×2 IMPLANT
BENZOIN TINCTURE PRP APPL 2/3 (GAUZE/BANDAGES/DRESSINGS) IMPLANT
BRR ADH 4X3 ABS CNTRL BYND (GAUZE/BANDAGES/DRESSINGS) ×2
CHLORAPREP W/TINT 26ML (MISCELLANEOUS) ×2 IMPLANT
CLAMP UMBILICAL CORD (MISCELLANEOUS) IMPLANT
CLOTH BEACON ORANGE TIMEOUT ST (SAFETY) ×2 IMPLANT
CLSR STERI-STRIP ANTIMIC 1/2X4 (GAUZE/BANDAGES/DRESSINGS) IMPLANT
DRAPE C SECTION CLR SCREEN (DRAPES) ×2 IMPLANT
DRSG OPSITE POSTOP 4X10 (GAUZE/BANDAGES/DRESSINGS) ×2 IMPLANT
ELECT REM PT RETURN 9FT ADLT (ELECTROSURGICAL) ×2
ELECTRODE REM PT RTRN 9FT ADLT (ELECTROSURGICAL) ×2 IMPLANT
EXTRACTOR VACUUM M CUP 4 TUBE (SUCTIONS) IMPLANT
GLOVE BIOGEL PI IND STRL 7.0 (GLOVE) ×4 IMPLANT
GLOVE ECLIPSE 6.5 STRL STRAW (GLOVE) ×2 IMPLANT
GOWN STRL REUS W/TWL LRG LVL3 (GOWN DISPOSABLE) ×4 IMPLANT
KIT ABG SYR 3ML LUER SLIP (SYRINGE) IMPLANT
NDL HYPO 25X5/8 SAFETYGLIDE (NEEDLE) IMPLANT
NEEDLE HYPO 22GX1.5 SAFETY (NEEDLE) ×2 IMPLANT
NEEDLE HYPO 25X5/8 SAFETYGLIDE (NEEDLE) IMPLANT
NS IRRIG 1000ML POUR BTL (IV SOLUTION) ×2 IMPLANT
PACK C SECTION WH (CUSTOM PROCEDURE TRAY) ×2 IMPLANT
PAD OB MATERNITY 4.3X12.25 (PERSONAL CARE ITEMS) ×2 IMPLANT
RTRCTR C-SECT PINK 25CM LRG (MISCELLANEOUS) IMPLANT
STRIP CLOSURE SKIN 1/2X4 (GAUZE/BANDAGES/DRESSINGS) IMPLANT
SUT CHROMIC GUT AB #0 18 (SUTURE) IMPLANT
SUT MNCRL 0 VIOLET CTX 36 (SUTURE) ×6 IMPLANT
SUT MON AB 2-0 SH 27 (SUTURE)
SUT MON AB 2-0 SH27 (SUTURE) IMPLANT
SUT MON AB 3-0 SH 27 (SUTURE)
SUT MON AB 3-0 SH27 (SUTURE) IMPLANT
SUT MON AB 4-0 PS1 27 (SUTURE) IMPLANT
SUT MONOCRYL 0 CTX 36 (SUTURE) ×6
SUT PLAIN 2 0 (SUTURE)
SUT PLAIN 2 0 XLH (SUTURE) IMPLANT
SUT PLAIN ABS 2-0 CT1 27XMFL (SUTURE) IMPLANT
SUT VIC AB 0 CT1 36 (SUTURE) ×4 IMPLANT
SUT VIC AB 2-0 CT1 27 (SUTURE) ×2
SUT VIC AB 2-0 CT1 TAPERPNT 27 (SUTURE) ×2 IMPLANT
SUT VIC AB 4-0 PS2 27 (SUTURE) IMPLANT
SYR CONTROL 10ML LL (SYRINGE) ×2 IMPLANT
TOWEL OR 17X24 6PK STRL BLUE (TOWEL DISPOSABLE) ×2 IMPLANT
TRAY FOLEY W/BAG SLVR 14FR LF (SET/KITS/TRAYS/PACK) IMPLANT
WATER STERILE IRR 1000ML POUR (IV SOLUTION) ×2 IMPLANT

## 2022-05-15 NOTE — H&P (Signed)
Mikayla Becker is a 36 y.o. female presenting for repeat C/S, TL @ 39 wk. IVF pregnancy. 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, cardiac and pulm problems, lived 2 wks PP Pre-E with first and 2nd        Past Medical History:  Diagnosis Date   Anxiety    GERD (gastroesophageal reflux disease)    History of postpartum gestational hypertension    Hypertension    PONV (postoperative nausea and vomiting)    had nausea and vomitting with both CS   Preterm labor    Past Surgical History:  Procedure Laterality Date   CESAREAN SECTION     MOUTH SURGERY     root canal   WISDOM TOOTH EXTRACTION Bilateral    Family History: family history includes Birth defects in her son; Diabetes in her maternal grandmother and maternal uncle; Heart disease in her son; Hypertension in her father and mother; Pulmonary Hypertension in her son; Stroke in her maternal grandmother. Social History:  reports that she has never smoked. She has never used smokeless tobacco. She reports that she does not currently use alcohol. She reports that she does not use drugs.     Maternal Diabetes: No Genetic Screening: Normal Maternal Ultrasounds/Referrals: Normal Fetal Ultrasounds or other Referrals:  Fetal echo, Referred to Materal Fetal Medicine  Maternal Substance Abuse:  No Significant Maternal Medications:  Meds include: Other:  baby ASA, Zoloft Significant Maternal Lab Results:  Group B Strep negative Number of Prenatal Visits:greater than 3 verified prenatal visits Other Comments:   IVF preg. Previous PTB x 2  Review of Systems  All other systems reviewed and are negative.  History   Blood pressure (!) 152/93, pulse 92, temperature 98.6 F (37 C), temperature source  Oral, resp. rate 18, height 5\' 7"  (1.702 m), weight 92.1 kg, last menstrual period 08/15/2021, SpO2 96 %, currently breastfeeding. Exam Physical Exam Constitutional:      Appearance: Normal appearance.  Eyes:     Extraocular Movements: Extraocular movements intact.  Cardiovascular:     Rate and Rhythm: Regular rhythm.     Heart sounds: Normal heart sounds.  Pulmonary:     Breath sounds: Normal breath sounds.  Abdominal:     Comments: Gravid Pfannensteil scar  Genitourinary:    Comments: Closed/50/-2 Musculoskeletal:        General: No swelling.     Cervical back: Neck supple.  Skin:    General: Skin is warm and dry.  Neurological:     General: No focal deficit present.     Mental Status: She is alert and oriented to person, place, and time.  Psychiatric:        Mood and Affect: Mood normal.        Behavior: Behavior normal.     Prenatal labs: ABO, Rh: --/--/AB POS Performed at [facility], [address], [place], [place]  [phone]) Antibody: NEG (10/12 0914) Rubella: Immune (04/05 0000) RPR: NON REACTIVE (10/12 0914)  HBsAg: Negative (04/05 0000)  HIV: Non-reactive (04/05 0000)  GBS:   negative  Assessment/Plan: Previous cesarean section Desires sterilization IVF pregnancy P) repeat C/S, TL. Procedure explained. Risk of surgery reviewed including infection, bleeding, injury to surrounding organ structures, possible need for blood transfusion and its risk( HIV, hepatitis, acute rxn). Permanent sterilization, non reversible, failure rate 1/300. All ? answered   Aidyn Sportsman A Iran Rowe 05/15/2022, 6:53 AM

## 2022-05-15 NOTE — Transfer of Care (Signed)
Immediate Anesthesia Transfer of Care Note  Patient: Mikayla Becker  Procedure(s) Performed: CESAREAN SECTION BILATERAL TUBAL LIGATION (Bilateral)  Patient Location: PACU  Anesthesia Type:Spinal  Level of Consciousness: awake  Airway & Oxygen Therapy: Patient Spontanous Breathing  Post-op Assessment: Report given to RN  Post vital signs: Reviewed and stable  Last Vitals:  Vitals Value Taken Time  BP 138/79 05/15/22 0918  Temp    Pulse 77 05/15/22 0920  Resp 20 05/15/22 0920  SpO2 98 % 05/15/22 0920  Vitals shown include unvalidated device data.  Last Pain:  Vitals:   05/15/22 0615  TempSrc: Oral         Complications: No notable events documented.

## 2022-05-15 NOTE — Anesthesia Procedure Notes (Signed)
Spinal  Patient location during procedure: OR Start time: 05/15/2022 7:37 AM End time: 05/15/2022 7:40 AM Reason for block: surgical anesthesia Staffing Performed: anesthesiologist  Anesthesiologist: Brennan Bailey, MD Performed by: Brennan Bailey, MD Authorized by: Brennan Bailey, MD   Preanesthetic Checklist Completed: patient identified, IV checked, risks and benefits discussed, monitors and equipment checked, pre-op evaluation and timeout performed Spinal Block Patient position: sitting Prep: DuraPrep and site prepped and draped Patient monitoring: heart rate, continuous pulse ox and blood pressure Approach: midline Location: L3-4 Injection technique: single-shot Needle Needle type: Pencan  Needle gauge: 24 G Needle length: 10 cm Assessment Sensory level: T4 Events: CSF return Additional Notes Risks, benefits, and alternative discussed. Patient gave consent to procedure. Prepped and draped in sitting position. Clear CSF obtained after one needle pass. Positive terminal aspiration. No pain or paraesthesias with injection. Patient tolerated procedure well. Vital signs stable. Tawny Asal, MD

## 2022-05-15 NOTE — Brief Op Note (Signed)
05/15/2022  9:27 AM  PATIENT:  Mikayla Becker  36 y.o. female  PRE-OPERATIVE DIAGNOSIS:  Previous Cesarean Section time two, Desire Sterilization  POST-OPERATIVE DIAGNOSIS:  Previous Cesarean section, desires sterilization  PROCEDURE:  Repeat Cesarean section, kerr hysterotomy. Modified [place] bilateral tubal ligation  SURGEON:  Surgeon(s) and Role:    * Mckenze Slone, Alanda Slim, MD - Primary  PHYSICIAN ASSISTANT:   ASSISTANTS: none   ANESTHESIA:   spinal FINDINGS; LIVE FEMALE direct OP. Nl tubes and ovaries, ant placenta  EBL:  556 ml   BLOOD ADMINISTERED:none  DRAINS: none   LOCAL MEDICATIONS USED:  MARCAINE     SPECIMEN:  Source of Specimen:  portion of right and left tube  DISPOSITION OF SPECIMEN:  PATHOLOGY  COUNTS:  YES  TOURNIQUET:  * No tourniquets in log *  DICTATION: .Other Dictation: Dictation Number [id number]  PLAN OF CARE: Admit to inpatient   PATIENT DISPOSITION:  PACU - hemodynamically stable.   Delay start of Pharmacological VTE agent (>24hrs) due to surgical blood loss or risk of bleeding: no

## 2022-05-15 NOTE — Op Note (Unsigned)
NAME: Mikayla Becker, RAMEY MEDICAL RECORD NO: [id number] ACCOUNT NO: [id number] DATE OF BIRTH: 11/10/1985 FACILITY: [facility] LOCATION: [facility] PHYSICIAN: Landis Dowdy A. Cherly Hensen, MD  Operative Report   DATE OF PROCEDURE: 05/15/2022  PREOPERATIVE DIAGNOSES:  Previous cesarean section x2, term gestation, desires sterilization, IVF pregnancy.  PROCEDURE:  Repeat cesarean section, Sharl Ma hysterotomy, modified Pomeroy tubal ligation.  POSTOPERATIVE DIAGNOSES:  Previous cesarean section x2, term gestation, desires sterilization, IVF pregnancy.   ANESTHESIA:  Spinal.  SURGEON:  Erine Phenix A. Cherly Hensen, MD.  ASSISTANT:  None.  DESCRIPTION OF PROCEDURE:  Under adequate spinal anesthesia, the patient was placed in the supine position with a left lateral tilt.  She was sterilely prepped and draped in the usual fashion.  An indwelling Foley catheter was sterilely placed.  0.25%  Marcaine was injected along the previous Pfannenstiel skin incision site.  Pfannenstiel skin incision was then made, carried down to the rectus fascia.  Rectus fascia opened transversely.  Rectus fascia was then bluntly and sharply dissected off the  rectus muscle in a superior and inferior fashion.  The rectus muscle was sharply split in the midline.  The parietal peritoneum was opened and extended superiorly. It was then noted that there was bladder adhesions high on the lower segment.  The  vesicouterine peritoneum at that point was then opened bluntly and the bladder displaced inferiorly.  Subsequently, a self-retaining Alexis retractor was then placed.  Vesicouterine peritoneum was further opened, a small lower uterine segment incision  was then made and extended bluntly in a cephalic and caudad fashion.  Incidental rupture of membranes occurred.  Clear amniotic fluid was noted.  Subsequent delivery of a live female from a direct occiput posterior presentation was initially attempted  without success.  Therefore, vacuum assistance  was then used using the mushroom vacuum. Subsequent delivery of a live female was then accomplished.  Baby was delayed cord clamp x1 minute.  The cord was then subsequently clamped, cut.  Baby was  transferred to the waiting pediatricians who assigned Apgars of 8 and 9 at 1 and 5 minutes.  Placenta was manually removed.  Uterine cavity was cleaned of debris.  Uterine incision had no extension.  Uterine cavity was reinspected.  The uterine incision  was closed in 2 layers, the first layer with 0 Monocryl running locked stitch, second layer was imbricating using 0 Monocryl suture.  Small bleeders were cauterized.  The abdomen was then irrigated and suctioned of debris.  At that point, attention was  then turned to both fallopian tubes starting with the left, the midportion of tube was grasped with a Babcock.  This opened up the underlying mesosalpinx and using 0 chromic free tie 2 proximally and 2 distally was then placed and the intervening  segment of fallopian tube was then removed and small cauterization was done at the stump as well as the base.  Good hemostasis noted.  Normal ovary noted on the left.  On the right the same procedure was performed once tube identified down to the fimbriated  end.  Midportion of the right fallopian tube was also removed. Normal right ovary was noted. At that point, the [facility] retractor was removed.  Interceed was placed in inverted T fashion overlying the uterine incision.  The parietal peritoneum was then closed with 2-0 Vicryl.  The rectus fascia was closed with 0 Vicryl x2.  The subcutaneous area was irrigated, small bleeders cauterized.  Interrupted 2-0 plain sutures placed and the skin approximated using 4-0 Monocryl subcuticular closure.  Steri-Strips  and benzoin was placed.  SPECIMEN:  Portion of right and left fallopian tubes sent to pathology.    COMPLICATION:  None.  ESTIMATED BLOOD LOSS:  553 mL.  INTRAOPERATIVE FLUIDS:  1800 mL.   URINE OUTPUT:  225 mL  clear yellow urine.    Sponge and instrument counts x2 was correct.  Complication was none.  The patient tolerated the procedure well and was transferred to recovery room in stable condition.     Elián.Darby D: 05/15/2022 9:24:16 am T: 05/15/2022 9:40:00 am  JOB: [id number]/ [id number]

## 2022-05-15 NOTE — Lactation Note (Signed)
This note was copied from a baby's chart. Lactation Consultation Note  Patient Name: Mikayla Becker [id number] Date: 05/15/2022 Reason for consult: Initial assessment;Term Age:38 hours  LC in to visit with P3 (1 infant passed) Mom of term baby delivered by C/Section.  Mom reports baby latched and fed twice already.  Family member holding baby swaddled.  Due to both prior babies in NICU, this is new to Mom, direct breastfeeding from the start.  Encouraged STS and watching for feeding cues.  Encouraged asking for help prn.   Mom requested a DEBP to use on her pump at home.  LC suspect that the DEBP won't fit her Medela pump at home, but Mom wanted it to take home and try.  Mom aware of IP and OP lactation support.  Maternal Data Has patient been taught Hand Expression?: Yes Does the patient have breastfeeding experience prior to this delivery?: Yes How long did the patient breastfeed?: 6 months with her first baby, second baby deceased from complications  Feeding Mother's Current Feeding Choice: Breast Milk  Interventions Interventions: Breast feeding basics reviewed;Skin to skin;Breast massage;Hand express;LC Services brochure  Discharge Pump: DEBP;Personal (Medela DEBP)  Consult Status Consult Status: Follow-up Date: 05/16/22 Follow-up type: In-patient    Mikayla Becker 05/15/2022, 2:01 PM

## 2022-05-15 NOTE — Anesthesia Postprocedure Evaluation (Signed)
Anesthesia Post Note  Patient: Mikayla Becker  Procedure(s) Performed: CESAREAN SECTION BILATERAL TUBAL LIGATION (Bilateral)     Patient location during evaluation: PACU Anesthesia Type: Spinal Level of consciousness: awake and alert Pain management: pain level controlled Vital Signs Assessment: post-procedure vital signs reviewed and stable Respiratory status: spontaneous breathing, nonlabored ventilation and respiratory function stable Cardiovascular status: blood pressure returned to baseline Postop Assessment: no apparent nausea or vomiting, spinal receding, no headache and no backache Anesthetic complications: no   No notable events documented.  Last Vitals:  Vitals:   05/15/22 1000 05/15/22 1105  BP: 138/88 134/82  Pulse: 76 82  Resp: 20 17  Temp:  36.7 C  SpO2: 96% 98%    Last Pain:  Vitals:   05/15/22 1105  TempSrc: Oral  PainSc: 0-No pain   Pain Goal:                Epidural/Spinal Function Cutaneous sensation: Able to Wiggle Toes (05/15/22 1105), Patient able to flex knees: Yes (05/15/22 1105), Patient able to lift hips off bed: Yes (05/15/22 1105), Back pain beyond tenderness at insertion site: No (05/15/22 1105), Progressively worsening motor and/or sensory loss: No (05/15/22 1105), Bowel and/or bladder incontinence post epidural: No (05/15/22 1105)  Marthenia Rolling

## 2022-05-16 LAB — CBC
HCT: 26.5 % — ABNORMAL LOW (ref 36.0–46.0)
Hemoglobin: 9.1 g/dL — ABNORMAL LOW (ref 12.0–15.0)
MCH: 29.3 pg (ref 26.0–34.0)
MCHC: 34.3 g/dL (ref 30.0–36.0)
MCV: 85.2 fL (ref 80.0–100.0)
Platelets: 174 10*3/uL (ref 150–400)
RBC: 3.11 MIL/uL — ABNORMAL LOW (ref 3.87–5.11)
RDW: 13.4 % (ref 11.5–15.5)
WBC: 9 10*3/uL (ref 4.0–10.5)
nRBC: 0 % (ref 0.0–0.2)

## 2022-05-16 MED ORDER — HYDROCHLOROTHIAZIDE 12.5 MG PO TABS
12.5000 mg | ORAL_TABLET | Freq: Every day | ORAL | Status: DC
  Administered 2022-05-16: 12.5 mg via ORAL
  Filled 2022-05-16: qty 1

## 2022-05-16 MED ORDER — HYDROCHLOROTHIAZIDE 25 MG PO TABS
25.0000 mg | ORAL_TABLET | Freq: Every day | ORAL | Status: DC
  Administered 2022-05-17 – 2022-05-18 (×2): 25 mg via ORAL
  Filled 2022-05-16 (×2): qty 1

## 2022-05-16 MED ORDER — OXYCODONE HCL 5 MG PO TABS
5.0000 mg | ORAL_TABLET | ORAL | Status: DC | PRN
  Administered 2022-05-16 – 2022-05-17 (×3): 5 mg via ORAL
  Filled 2022-05-16 (×3): qty 1

## 2022-05-16 NOTE — Progress Notes (Signed)
MOB was referred for history of depression/anxiety. * Referral screened out by Clinical Social Worker because none of the following criteria appear to apply: ~ History of anxiety/depression during this pregnancy, or of post-partum depression following prior delivery. ~ Diagnosis of anxiety and/or depression within last 3 years OR * MOB's symptoms currently being treated with medication and/or therapy. MOB has an active prescription for Zoloft 50mg/day. No mental health concerns noted in MOB's prenatal care records.  Please contact the Clinical Social Worker if needs arise, by MOB request, or if MOB scores greater than 9/yes to question 10 on Edinburgh Postpartum Depression Screen.  Signed,  Loghan Subia K. Tamari Redwine, MSW, LCSWA, LCASA 05/16/2022 10:12 AM 

## 2022-05-16 NOTE — Progress Notes (Signed)
SVD: repeat  S:  Pt reports feeling well/ Tolerating po/ Voiding without problems/ No n/v/ Bleeding is light/ Pain controlled withprescription NSAID's including ibuprofen (Motrin) and narcotic analgesics including oxycodone (Oxycontin, Oxyir)    O:  A & O x 3 / VS: Blood pressure 131/80, pulse 82, temperature 97.9 F (36.6 C), temperature source Oral, resp. rate 18, height 5\' 7"  (1.702 m), weight 92.1 kg, last menstrual period 08/15/2021, SpO2 100 %, currently breastfeeding. Patient Vitals for the past 24 hrs:  BP Temp Temp src Pulse Resp SpO2  05/16/22 0510 131/80 97.9 F (36.6 C) Oral 82 18 --  05/15/22 2230 128/80 98.2 F (36.8 C) Oral 74 18 --  05/15/22 1758 131/83 98 F (36.7 C) Oral 68 17 100 %  05/15/22 1411 135/79 98.4 F (36.9 C) Oral 89 17 99 %  05/15/22 1311 135/81 98.1 F (36.7 C) Oral 80 16 98 %  05/15/22 1201 133/82 98.1 F (36.7 C) Oral 74 18 100 %  05/15/22 1105 134/82 98 F (36.7 C) Oral 82 17 98 %  05/15/22 1015 139/83 -- -- 71 16 96 %  05/15/22 1000 138/88 -- -- 76 20 96 %     LABS:  Results for orders placed or performed during the hospital encounter of 05/15/22 (from the past 24 hour(s))  Protein / creatinine ratio, urine     Status: Abnormal   Collection Time: 05/15/22  9:59 AM  Result Value Ref Range   Creatinine, Urine 70 mg/dL   Total Protein, Urine 19 mg/dL   Protein Creatinine Ratio 0.27 (H) 0.00 - 0.15 mg/mg[Cre]  Comprehensive metabolic panel     Status: Abnormal   Collection Time: 05/15/22 10:12 AM  Result Value Ref Range   Sodium 137 135 - 145 mmol/L   Potassium 4.3 3.5 - 5.1 mmol/L   Chloride 107 98 - 111 mmol/L   CO2 21 (L) 22 - 32 mmol/L   Glucose, Bld 96 70 - 99 mg/dL   BUN 7 6 - 20 mg/dL   Creatinine, Ser 05/17/22 0.44 - 1.00 mg/dL   Calcium 8.0 (L) 8.9 - 10.3 mg/dL   Total Protein 5.1 (L) 6.5 - 8.1 g/dL   Albumin 2.3 (L) 3.5 - 5.0 g/dL   AST 17 15 - 41 U/L   ALT 11 0 - 44 U/L   Alkaline Phosphatase 76 38 - 126 U/L   Total Bilirubin  0.3 0.3 - 1.2 mg/dL   GFR, Estimated 9.02 >40 mL/min   Anion gap 9 5 - 15  CBC     Status: Abnormal   Collection Time: 05/15/22 10:12 AM  Result Value Ref Range   WBC 8.5 4.0 - 10.5 K/uL   RBC 3.54 (L) 3.87 - 5.11 MIL/uL   Hemoglobin 10.2 (L) 12.0 - 15.0 g/dL   HCT 05/17/22 (L) 35.3 - 29.9 %   MCV 85.9 80.0 - 100.0 fL   MCH 28.8 26.0 - 34.0 pg   MCHC 33.6 30.0 - 36.0 g/dL   RDW 24.2 68.3 - 41.9 %   Platelets 195 150 - 400 K/uL   nRBC 0.0 0.0 - 0.2 %  CBC     Status: Abnormal   Collection Time: 05/16/22  5:10 AM  Result Value Ref Range   WBC 9.0 4.0 - 10.5 K/uL   RBC 3.11 (L) 3.87 - 5.11 MIL/uL   Hemoglobin 9.1 (L) 12.0 - 15.0 g/dL   HCT 05/18/22 (L) 29.7 - 98.9 %   MCV 85.2 80.0 - 100.0 fL  MCH 29.3 26.0 - 34.0 pg   MCHC 34.3 30.0 - 36.0 g/dL   RDW 13.4 11.5 - 15.5 %   Platelets 174 150 - 400 K/uL   nRBC 0.0 0.0 - 0.2 %     I&O: I/O last 3 completed shifts: In: 1500 [I.V.:1500] Out: 3248 [Urine:2650; Blood:598]   No intake/output data recorded.  Lungs: chest clear, no wheezing, rales, normal symmetric air entry  Heart: regular rate and rhythm, S1, S2 normal, no murmur, click, rub or gallop  Abdomen: soft uterus firm at umb Incision c/d/i  Perineum: is normal  Lochia: just changed  Extremities:no redness or tenderness in the calves or thighs, no edema    A/P: POD # 1/PPD # 1/ [id number] Gestational HTN on procardia Depression/anxiety on zoloft  Doing well  Continue routine post partum orders  Cont procardia. Will do HCTZ as well Request early d/c tomorrow

## 2022-05-16 NOTE — Progress Notes (Signed)
RN called Dr Garwin Brothers at [facility] regarding pt's blood pressures. Dr Garwin Brothers aware, no new orders given, no new parameter given. Oncoming RN made aware of pt's blood pressures and communication with Dr. Garwin Brothers.

## 2022-05-17 LAB — CBC
HCT: 28.5 % — ABNORMAL LOW (ref 36.0–46.0)
Hemoglobin: 9.7 g/dL — ABNORMAL LOW (ref 12.0–15.0)
MCH: 29.5 pg (ref 26.0–34.0)
MCHC: 34 g/dL (ref 30.0–36.0)
MCV: 86.6 fL (ref 80.0–100.0)
Platelets: 203 10*3/uL (ref 150–400)
RBC: 3.29 MIL/uL — ABNORMAL LOW (ref 3.87–5.11)
RDW: 13.5 % (ref 11.5–15.5)
WBC: 8.2 10*3/uL (ref 4.0–10.5)
nRBC: 0 % (ref 0.0–0.2)

## 2022-05-17 LAB — COMPREHENSIVE METABOLIC PANEL
ALT: 12 U/L (ref 0–44)
AST: 16 U/L (ref 15–41)
Albumin: 2.4 g/dL — ABNORMAL LOW (ref 3.5–5.0)
Alkaline Phosphatase: 73 U/L (ref 38–126)
Anion gap: 5 (ref 5–15)
BUN: 7 mg/dL (ref 6–20)
CO2: 26 mmol/L (ref 22–32)
Calcium: 8.3 mg/dL — ABNORMAL LOW (ref 8.9–10.3)
Chloride: 105 mmol/L (ref 98–111)
Creatinine, Ser: 0.69 mg/dL (ref 0.44–1.00)
GFR, Estimated: >60 mL/min (ref >60)
Glucose, Bld: 83 mg/dL (ref 70–99)
Potassium: 4.1 mmol/L (ref 3.5–5.1)
Sodium: 136 mmol/L (ref 135–145)
Total Bilirubin: 0.3 mg/dL (ref 0.3–1.2)
Total Protein: 5.2 g/dL — ABNORMAL LOW (ref 6.5–8.1)

## 2022-05-17 LAB — PROTEIN / CREATININE RATIO, URINE
Creatinine, Urine: 34 mg/dL
Total Protein, Urine: <6 mg/dL

## 2022-05-17 MED ORDER — LABETALOL HCL 100 MG PO TABS
100.0000 mg | ORAL_TABLET | Freq: Two times a day (BID) | ORAL | Status: DC
  Administered 2022-05-17 – 2022-05-18 (×2): 100 mg via ORAL
  Filled 2022-05-17 (×2): qty 1

## 2022-05-17 MED ORDER — NIFEDIPINE ER OSMOTIC RELEASE 30 MG PO TB24
60.0000 mg | ORAL_TABLET | Freq: Every day | ORAL | Status: DC
  Administered 2022-05-17: 60 mg via ORAL
  Filled 2022-05-17: qty 2

## 2022-05-17 NOTE — Progress Notes (Signed)
SVD: {findings; c - section delivery reason:31449}  S:  Pt reports feeling ***/ Tolerating po/ Voiding without problems/ No n/v/ Bleeding is {Description; bleeding vaginal:11356}/ Pain controlled with{treatments; pain control med:13496}    O:  A & O x 3 ***/ VS: Blood pressure 132/81, pulse 99, temperature 97.9 F (36.6 C), temperature source Oral, resp. rate 18, height 5\' 7"  (1.702 m), weight 92.1 kg, last menstrual period 08/15/2021, SpO2 100 %, currently breastfeeding.  LABS:  Results for orders placed or performed during the hospital encounter of 05/15/22 (from the past 24 hour(s))  CBC     Status: Abnormal   Collection Time: 05/17/22  7:15 AM  Result Value Ref Range   WBC 8.2 4.0 - 10.5 K/uL   RBC 3.29 (L) 3.87 - 5.11 MIL/uL   Hemoglobin 9.7 (L) 12.0 - 15.0 g/dL   HCT 28.5 (L) 36.0 - 46.0 %   MCV 86.6 80.0 - 100.0 fL   MCH 29.5 26.0 - 34.0 pg   MCHC 34.0 30.0 - 36.0 g/dL   RDW 13.5 11.5 - 15.5 %   Platelets 203 150 - 400 K/uL   nRBC 0.0 0.0 - 0.2 %  Comprehensive metabolic panel     Status: Abnormal   Collection Time: 05/17/22  7:15 AM  Result Value Ref Range   Sodium 136 135 - 145 mmol/L   Potassium 4.1 3.5 - 5.1 mmol/L   Chloride 105 98 - 111 mmol/L   CO2 26 22 - 32 mmol/L   Glucose, Bld 83 70 - 99 mg/dL   BUN 7 6 - 20 mg/dL   Creatinine, Ser 0.69 0.44 - 1.00 mg/dL   Calcium 8.3 (L) 8.9 - 10.3 mg/dL   Total Protein 5.2 (L) 6.5 - 8.1 g/dL   Albumin 2.4 (L) 3.5 - 5.0 g/dL   AST 16 15 - 41 U/L   ALT 12 0 - 44 U/L   Alkaline Phosphatase 73 38 - 126 U/L   Total Bilirubin 0.3 0.3 - 1.2 mg/dL   GFR, Estimated >60 >60 mL/min   Anion gap 5 5 - 15  Protein / creatinine ratio, urine     Status: None   Collection Time: 05/17/22  8:44 AM  Result Value Ref Range   Creatinine, Urine 34 mg/dL   Total Protein, Urine <6 mg/dL   Protein Creatinine Ratio        0.00 - 0.15 mg/mg[Cre]    I&O: I/O last 3 completed shifts: In: -  Out: 1400 [Urine:1400]   No intake/output data  recorded.  Lungs: {Exam; lungs:5033}  Heart: {Exam; heart:5510}  Abdomen: {PE ABDOMEN POSTPARTUM OBGYN:313106}  Perineum: {exam; perineum:10172}  Lochia: ***  Extremities:{pe extremities OE:703500}    A/P: POD # ***/PPD # ***/ [id number]  Doing well  Continue routine post partum orders  ***

## 2022-05-17 NOTE — Progress Notes (Signed)
RN called Dr Garwin Brothers with CBC, CMET, PCR results-all WNL per MD.  Continue with routine care and call for Blood pressures >/= 160/110

## 2022-05-17 NOTE — Lactation Note (Signed)
This note was copied from a baby's chart. Lactation Consultation Note  Patient Name: Mikayla Becker [id number] Date: 05/17/2022 Age - 9 hours old,  Reason for consult: Follow-up assessment;Term;Infant weight loss (8 % weight loss per mom the baby last fed at 1545 and supplemented. Using a NS from home. #20) Per mom has been using the hand pump and getting some colostrum and spoon feeding it back to the baby.  LC offered to assess the sizing of the nipple shield. The #20 NS mom brought from home is the right fit. LC provided the curved tip syringe where she can instill the EBM in the top for an appetizer and then latch.  LC provided breast shells for between feedings for the areola edema.  LC recommended prior to latch :  Breast massage, hand express, pre-pump with the hand pump to prime the milk ducts ,and reverse pressure to elongate the nipple / areola complex so the NS fits properly.  After feeding 15- 20 mins ( 30 mins max ) supplement 30 ml .  LC encouraged mom to think about recommendation to post pump with the DEBP and the LC would be willing to set it up.   LC encouraged mom to try to nap while the baby is napping.  Maternal Data    Feeding Mother's Current Feeding Choice: Breast Milk and Formula  LATCH Score - LC encouraged to call for latch assessment with Nipple shield.      Lactation Tools Discussed/Used Tools: Shells;Flanges Nipple shield size: 20 Flange Size: 24 Breast pump type: Manual;Other (comment) (DEBP kit sitting on the counter, per mom has been using the HP ( LC reviewed ). LC recommended + offered to set up the DEBP for post pumping. Per mom plans to pump when she can go home tomorrrow. 2nd option prior to latching - pre- pump with the HP) Pump Education: Setup, frequency, and cleaning;Other (comment) (reviewed the set up of the hand pump)  Interventions Interventions: Breast feeding basics reviewed;Hand pump;Education;Shells  Discharge Pump:  DEBP;Personal;Manual  Consult Status Consult Status: Follow-up Date: 05/18/22 Follow-up type: In-patient    Guadalupe Guerra 05/17/2022, 5:37 PM

## 2022-05-17 NOTE — Progress Notes (Signed)
Pt. BP 147/92 @ 2023. Pt took an [place] and slept for 2 hours. RN rechecked BP @ 0029 148/101 RN called Dr. Garwin Brothers to update on BP. No new orders given, no new parameters given.

## 2022-05-18 LAB — SURGICAL PATHOLOGY

## 2022-05-18 MED ORDER — INFLUENZA VAC SPLIT QUAD 0.5 ML IM SUSY
0.5000 mL | PREFILLED_SYRINGE | INTRAMUSCULAR | Status: AC | PRN
  Administered 2022-05-18: 0.5 mL via INTRAMUSCULAR
  Filled 2022-05-18: qty 0.5

## 2022-05-18 MED ORDER — IBUPROFEN 600 MG PO TABS: 600.0000 mg | ORAL_TABLET | Freq: Four times a day (QID) | ORAL | 11 refills | Status: DC | PRN

## 2022-05-18 MED ORDER — OXYCODONE HCL 5 MG PO TABS: 5.0000 mg | ORAL_TABLET | ORAL | 0 refills | Status: AC | PRN

## 2022-05-18 MED ORDER — LABETALOL HCL 100 MG PO TABS: 100.0000 mg | ORAL_TABLET | Freq: Two times a day (BID) | ORAL | 0 refills | Status: DC

## 2022-05-18 MED ORDER — HYDROCHLOROTHIAZIDE 25 MG PO TABS: 25.0000 mg | ORAL_TABLET | Freq: Every day | ORAL | 0 refills | Status: DC

## 2022-05-18 NOTE — Lactation Note (Signed)
This note was copied from a baby's chart. Lactation Consultation Note  Patient Name: Mikayla Becker [id number]'Y Date: 05/18/2022 Reason for consult: Follow-up assessment Age:37 hours  P3: Term infant at 39+0 weeks Feeding preference: Breast/formula Weight loss:7%  "[name]" was swaddled and asleep in the bassinet when I arrived.  Birth parent had no questions/concerns related to breast feeding.  She has been breast feeding and supplementing with formula.  Last LATCH score was a 9; baby is voiding/stooling.  Family has our op phone number for any concerns after discharge.  Birth parent has a Science writer at her pediatric office whom she will visit as needed.  No support person present at this time.   Maternal Data    Feeding    LATCH Score Latch: Grasps breast easily, tongue down, lips flanged, rhythmical sucking.  Audible Swallowing: Spontaneous and intermittent  Type of Nipple: Everted at rest and after stimulation  Comfort (Breast/Nipple): Soft / non-tender  Hold (Positioning): Assistance needed to correctly position infant at breast and maintain latch.  LATCH Score: 9   Lactation Tools Discussed/Used    Interventions Interventions: Breast feeding basics reviewed;Assisted with latch;Hand express;Adjust position;Support pillows  Discharge Discharge Education: Engorgement and breast care  Consult Status Consult Status: Complete Date: 05/18/22 Follow-up type: Call as needed    Xavier Fournier R Indea Dearman 05/18/2022, 10:10 AM

## 2022-05-18 NOTE — Discharge Summary (Signed)
Postpartum Discharge Summary  Date of Service updated     Patient Name: Mikayla Becker DOB: 03-11-1986 MRN: [id number]  Date of admission: 05/15/2022 Delivery date:05/15/2022  Delivering provider: Aloha Becker  Date of discharge: 05/18/2022  Admitting diagnosis: Previous cesarean section complicating pregnancy [O25.189] Postpartum care following cesarean delivery [Z39.2] Intrauterine pregnancy: [redacted]w[redacted]d    Secondary diagnosis:  Principal Problem:   Previous cesarean section complicating pregnancy Active Problems:   Postpartum care following cesarean delivery  Additional problems: gestational hypertension. IVF pregnancy. Previous cesarean section    Discharge diagnosis: Term Pregnancy Delivered and Gestational Hypertension  , IVF pregnancy,  Anemia    previous cesarean section                                        Post partum procedures: n/a Augmentation: N/A Complications: None  Hospital course: Sceduled C/S   36y.o. yo [id number] admitted to the hospital 05/15/2022 for scheduled cesarean section with the following indication:Elective Repeat.Delivery details are as follows:  Membrane Rupture Time/Date: 8:08 AM ,05/15/2022   Delivery Method:C-Section, Low Transverse  Details of operation can be found in separate operative note.  Patient had a postpartum course complicated by treatment for migraine, antihypertensive for gestational HTN ( procardia and HCTZ). PIH labs normal. BP med changed from procardia to labetalol due to h/a side effect.  She is ambulating, tolerating a regular diet, passing flatus, and urinating well. Patient is discharged home in stable condition on  05/21/2022        Newborn Data: Birth date:05/15/2022  Birth time:8:09 AM  Gender:Female  Living status:Living  Apgars:8 ,9  Weight:3.72 kg     Magnesium Sulfate received: No BMZ received: No Rhophylac:No MMR:No T-DaP:Given prenatally Flu: No Transfusion:No  Physical exam   Vitals:   05/17/22 1855 05/17/22 2015 05/18/22 0000 05/18/22 0355  BP: (!) 133/91 136/79 136/87 (!) 141/94  Pulse: 76 88 94 (!) 102  Resp:  '18 16 15  ' Temp:  98.1 F (36.7 C) 97.8 F (36.6 C) 97.7 F (36.5 C)  TempSrc:  Oral Oral Oral  SpO2:  99% 99% 100%  Weight:      Height:       General: alert, cooperative, and no distress Lochia: appropriate Uterine Fundus: firm Incision: Dressing is clean, dry, and intact DVT Evaluation: No evidence of DVT seen on physical exam. Labs: Lab Results  Component Value Date   WBC 8.2 05/17/2022   HGB 9.7 (L) 05/17/2022   HCT 28.5 (L) 05/17/2022   MCV 86.6 05/17/2022   PLT 203 05/17/2022      Latest Ref Rng & Units 05/17/2022    7:15 AM  CMP  Glucose 70 - 99 mg/dL 83   BUN 6 - 20 mg/dL 7   Creatinine 0.44 - 1.00 mg/dL 0.69   Sodium 135 - 145 mmol/L 136   Potassium 3.5 - 5.1 mmol/L 4.1   Chloride 98 - 111 mmol/L 105   CO2 22 - 32 mmol/L 26   Calcium 8.9 - 10.3 mg/dL 8.3   Total Protein 6.5 - 8.1 g/dL 5.2   Total Bilirubin 0.3 - 1.2 mg/dL 0.3   Alkaline Phos 38 - 126 U/L 73   AST 15 - 41 U/L 16   ALT 0 - 44 U/L 12    Edinburgh Score:    05/15/2022   11:05 AM  EdFlavia Becker  Postnatal Depression Scale Screening Tool  I have been able to laugh and see the funny side of things. 0  I have looked forward with enjoyment to things. 0  I have blamed myself unnecessarily when things went wrong. 0  I have been anxious or worried for no good reason. 0  I have felt scared or panicky for no good reason. 0  Things have been getting on top of me. 0  I have been so unhappy that I have had difficulty sleeping. 0  I have felt sad or miserable. 0  I have been so unhappy that I have been crying. 0  The thought of harming myself has occurred to me. 0  Edinburgh Postnatal Depression Scale Total 0      After visit meds:  Allergies as of 05/18/2022   No Known Allergies      Medication List     TAKE these medications    calcium carbonate  500 MG chewable tablet Commonly known as: TUMS - dosed in mg elemental calcium Chew 1 tablet by mouth 3 (three) times daily as needed for indigestion or heartburn.   hydrochlorothiazide 25 MG tablet Commonly known as: HYDRODIURIL Take 1 tablet (25 mg total) by mouth daily for 5 days.   ibuprofen 600 MG tablet Commonly known as: ADVIL Take 1 tablet (600 mg total) by mouth every 6 (six) hours as needed.   labetalol 100 MG tablet Commonly known as: NORMODYNE Take 1 tablet (100 mg total) by mouth 2 (two) times daily.   oxyCODONE 5 MG immediate release tablet Commonly known as: Oxy IR/ROXICODONE Take 1 tablet (5 mg total) by mouth every 4 (four) hours as needed for up to 7 days for severe pain or moderate pain.   prenatal multivitamin Tabs tablet Take 1 tablet by mouth daily.   sertraline 50 MG tablet Commonly known as: ZOLOFT Take 50 mg by mouth See admin instructions. Take 50 mg daily, may increase dose to 75 mg as needed for depression         Discharge home in stable condition Infant Feeding: Breast Infant Disposition:home with mother Discharge instruction: per After Visit Summary and Postpartum booklet. Activity: Advance as tolerated. Pelvic rest for 6 weeks.  Diet: low salt diet Anticipated Birth Control: Unsure Postpartum Appointment:6 weeks Additional Postpartum F/U: BP check 2 wk Future Appointments:No future appointments. Follow up Visit:  Follow-up Information     Servando Salina, MD Follow up in 2 week(s).   Specialty: Obstetrics and Gynecology Why: bp check Contact information: [address] [place] [address] [phone]                     05/18/2022 [name], MD

## 2022-05-18 NOTE — Progress Notes (Signed)
SVD: {findings; c - section delivery reason:31449}  S:  Pt reports feeling ***/ Tolerating po/ Voiding without problems/ No n/v/ Bleeding is {Description; bleeding vaginal:11356}/ Pain controlled with{treatments; pain control med:13496}    O:  A & O x 3 ***/ VS: Blood pressure (!) 141/94, pulse (!) 102, temperature 97.7 F (36.5 C), temperature source Oral, resp. rate 15, height 5\' 7"  (1.702 m), weight 92.1 kg, last menstrual period 08/15/2021, SpO2 100 %, currently breastfeeding.  LABS:  Results for orders placed or performed during the hospital encounter of 05/15/22 (from the past 24 hour(s))  Protein / creatinine ratio, urine     Status: None   Collection Time: 05/17/22  8:44 AM  Result Value Ref Range   Creatinine, Urine 34 mg/dL   Total Protein, Urine <6 mg/dL   Protein Creatinine Ratio        0.00 - 0.15 mg/mg[Cre]    I&O: No intake/output data recorded.   No intake/output data recorded.  Lungs: {Exam; lungs:5033}  Heart: {Exam; heart:5510}  Abdomen: {PE ABDOMEN POSTPARTUM OBGYN:313106}  Perineum: {exam; perineum:10172}  Lochia: ***  Extremities:{pe extremities OM:600459}    A/P: POD # ***/PPD # ***/ [id number]  Doing well  Continue routine post partum orders  ***

## 2022-05-18 NOTE — Discharge Instructions (Signed)
Call if temperature greater than equal to 100.4, nothing per vagina for 4-6 weeks or severe nausea vomiting, increased incisional pain , drainage or redness in the incision site, no straining with bowel movements, showers no bath °

## 2022-05-18 NOTE — Plan of Care (Signed)
Discharge instructions given with after visit summary, pt receptive.  

## 2022-05-26 ENCOUNTER — Telehealth (HOSPITAL_COMMUNITY): Payer: Self-pay | Admitting: *Deleted

## 2022-05-26 NOTE — Telephone Encounter (Signed)
Mom reports feeling good. Incision healing well per mom. No concerns regarding herself at this time. EPDS=1 (hospital score=0) Mom reports baby is well. Feeding, peeing, and pooping without difficulty. Reviewed safe sleep. Mom has no concerns about baby at present.  Odis Hollingshead, RN 05-26-2022 at 10:19am

## 2022-06-17 ENCOUNTER — Ambulatory Visit: Payer: 59 | Attending: Cardiology | Admitting: Cardiology

## 2022-06-17 ENCOUNTER — Encounter: Payer: Self-pay | Admitting: Cardiology

## 2022-06-17 VITALS — BP 122/78 | HR 66 | Ht 67.0 in | Wt 182.6 lb

## 2022-06-17 DIAGNOSIS — O165 Unspecified maternal hypertension, complicating the puerperium: Secondary | ICD-10-CM

## 2022-06-17 NOTE — Progress Notes (Signed)
[facility]s [facility]  New Evaluation  Date:  06/17/2022   ID:  Mikayla Becker, DOB 09-22-1985, MRN [id number]  PCP:  Elijio Miles., MD   [facility] Providers Cardiologist:  Thomasene Ripple, DO  Electrophysiologist:  None       Referring MD: Elijio Miles., MD   Chief Complaint: " I am ok"  History of Present Illness:    Mikayla Becker is a 36 y.o. female [[id number]] who is being seen today for the evaluation of postpartum hypertension  at the request of Elijio Miles., MD.   Medical history includes GERD, gestational hypertension with accelerated postpartum hypertension here today to be evaluated for her elevated blood pressure.  The patient follows with Dr. Cherly Hensen and is currently on hydrochlorothiazide 25 mg daily, labetalol 100 mg twice a day.  She tells me since her medication was recently adjusted she has not had any elevated blood pressure.  Notes that after each pregnancy she has had 2 babies she has had postpartum hypertension.  But she has not been checked in between there and did not know if she has chronic hypertension.  No chest pain or shortness of breath.  Prior CV Studies Reviewed: The following studies were reviewed today: None   Past Medical History:  Diagnosis Date   Anxiety    GERD (gastroesophageal reflux disease)    History of postpartum gestational hypertension    Hypertension    PONV (postoperative nausea and vomiting)    had nausea and vomitting with both CS   Preterm labor     Past Surgical History:  Procedure Laterality Date   CESAREAN SECTION     CESAREAN SECTION N/A 05/15/2022   Procedure: CESAREAN SECTION;  Surgeon: Maxie Better, MD;  Location: [facility] LD ORS;  Service: Obstetrics;  Laterality: N/A;   MOUTH SURGERY     root canal   TUBAL LIGATION Bilateral 05/15/2022   Procedure: BILATERAL TUBAL LIGATION;  Surgeon: Maxie Better, MD;  Location: [facility] ORS;  Service: Obstetrics;  Laterality: Bilateral;    WISDOM TOOTH EXTRACTION Bilateral       OB History     Gravida  4   Para  3   Term  1   Preterm  2   AB  1   Living  2      SAB  0   IAB  1   Ectopic      Multiple  0   Live Births  3        Obstetric Comments  1st- PPROM, breech, c/s 2nd-  severe hydrops, 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 Taking 150mg  BID   Multiple Vitamins-Minerals (MULTIVITAMIN WITH MINERALS) tablet Take 1 tablet by mouth daily.   sertraline (ZOLOFT) 50 MG tablet Take 50 mg by mouth See admin instructions. Take 50 mg daily, may increase dose to 75 mg as needed for depression     Allergies:   Patient has no known allergies.   Social History   Socioeconomic History   Marital status: Married    Spouse name: Not on file   Number of children: Not on file   Years of education: Not on file   Highest education level: Not on file  Occupational History   Not on file  Tobacco Use   Smoking status: Never   Smokeless tobacco: Never  Vaping Use   Vaping Use: Never used  Substance and Sexual Activity   Alcohol use: Not Currently   Drug use: No   Sexual activity: Not Currently    Birth control/protection: None  Other Topics Concern   Not on file  Social History Narrative   Not on file   Social Determinants of Health   Financial Resource Strain: Not on file  Food Insecurity: Not on file  Transportation Needs: Not on file  Physical Activity: Not on file  Stress: Not on file  Social Connections: Not on file      Family History  Problem Relation Age of Onset   Hypertension Mother    Hypertension Father    Birth defects Son    Heart disease Son    Pulmonary Hypertension Son    Diabetes Maternal Uncle    Stroke Maternal Grandmother    Diabetes Maternal Grandmother     Cancer Neg Hx       ROS:   Please see the history of present illness.     All other systems reviewed and are negative.   Labs/EKG Reviewed:    EKG:   EKG is was ordered today.  The ekg ordered today demonstrates sinus rhythm, heart rate 66 beats minute.  Recent Labs: 05/17/2022: ALT 12; BUN 7; Creatinine, Ser 0.69; Hemoglobin 9.7; Platelets 203; Potassium 4.1; Sodium 136   Recent Lipid Panel No results found for: "CHOL", "TRIG", "HDL", "CHOLHDL", "LDLCALC", "LDLDIRECT"  Physical Exam:    VS:  BP 122/78 (BP Location: Right Arm, Patient Position: Sitting)   Pulse 66   Ht 5\' 7"  (1.702 m)   Wt 182 lb 9.6 oz (82.8 kg)   LMP 08/15/2021   SpO2 98%   BMI 28.60 kg/m     Wt Readings from Last 3 Encounters:  06/17/22 182 lb 9.6 oz (82.8 kg)  05/15/22 203 lb (92.1 kg)  05/07/22 200 lb (90.7 kg)     GEN:  Well nourished, well developed in no acute distress HEENT: Normal NECK: No JVD; No carotid bruits LYMPHATICS: No lymphadenopathy CARDIAC: RRR, no murmurs, rubs, gallops RESPIRATORY:  Clear to auscultation without rales, wheezing or rhonchi  ABDOMEN: Soft, non-tender, non-distended MUSCULOSKELETAL:  No edema; No deformity  SKIN: Warm and dry NEUROLOGIC:  Alert and oriented x 3 PSYCHIATRIC:  Normal affect    Risk Assessment/Risk Calculators:                 ASSESSMENT & PLAN:    Postpartum hypertension -her blood pressure which was done by me in the office 122/78 mmHg.  For now we will keep her on her current medication regimen.  But what I would really like to understand if there is any structural abnormalities from her intermittent postpartum hypertension which really could be longstanding hypertension since she does not get her blood pressure checked during her nonpregnant times.  Is an order for an echo.   Patient Instructions  Medication Instructions:  Your physician recommends that you continue on your current medications as directed. Please refer to the  Current Medication list given to you today.  *If you need a refill on your cardiac medications before your next appointment, please call your pharmacy*   Lab Work: NONE If you have labs (blood work) drawn today and your tests are completely normal, you will receive your results only by: MyChart Message (if  you have MyChart) OR A paper copy in the mail If you have any lab test that is abnormal or we need to change your treatment, we will call you to review the results.   Testing/Procedures: Your physician has requested that you have an echocardiogram. Echocardiography is a painless test that uses sound waves to create images of your heart. It provides your doctor with information about the size and shape of your heart and how well your heart's chambers and valves are working. This procedure takes approximately one hour. There are no restrictions for this procedure. Please do NOT wear cologne, perfume, aftershave, or lotions (deodorant is allowed). Please arrive 15 minutes prior to your appointment time.    Follow-Up: At [facility], you and your health needs are our priority.  As part of our continuing mission to provide you with exceptional heart care, we have created designated Provider Care Teams.  These Care Teams include your primary Cardiologist (physician) and Advanced Practice Providers (APPs -  Physician Assistants and Nurse Practitioners) who all work together to provide you with the care you need, when you need it.  We recommend signing up for the patient portal called "MyChart".  Sign up information is provided on this After Visit Summary.  MyChart is used to connect with patients for Virtual Visits (Telemedicine).  Patients are able to view lab/test results, encounter notes, upcoming appointments, etc.  Non-urgent messages can be sent to your provider as well.   To learn more about what you can do with MyChart, go to [email or web address].    Your next appointment:    12 week(s)  The format for your next appointment:   In Person  Provider:   Thomasene Ripple, DO     Dispo:  Return in about 12 weeks (around 09/09/2022).   Medication Adjustments/Labs and Tests Ordered: Current medicines are reviewed at length with the patient today.  Concerns regarding medicines are outlined above.  Tests Ordered: Orders Placed This Encounter  Procedures   EKG 12-Lead   ECHOCARDIOGRAM COMPLETE   Medication Changes: No orders of the defined types were placed in this encounter.

## 2022-06-17 NOTE — Patient Instructions (Signed)
Medication Instructions:  Your physician recommends that you continue on your current medications as directed. Please refer to the Current Medication list given to you today.  *If you need a refill on your cardiac medications before your next appointment, please call your pharmacy*   Lab Work: NONE If you have labs (blood work) drawn today and your tests are completely normal, you will receive your results only by: MyChart Message (if you have MyChart) OR A paper copy in the mail If you have any lab test that is abnormal or we need to change your treatment, we will call you to review the results.   Testing/Procedures: Your physician has requested that you have an echocardiogram. Echocardiography is a painless test that uses sound waves to create images of your heart. It provides your doctor with information about the size and shape of your heart and how well your heart's chambers and valves are working. This procedure takes approximately one hour. There are no restrictions for this procedure. Please do NOT wear cologne, perfume, aftershave, or lotions (deodorant is allowed). Please arrive 15 minutes prior to your appointment time.    Follow-Up: At [facility], you and your health needs are our priority.  As part of our continuing mission to provide you with exceptional heart care, we have created designated Provider Care Teams.  These Care Teams include your primary Cardiologist (physician) and Advanced Practice Providers (APPs -  Physician Assistants and Nurse Practitioners) who all work together to provide you with the care you need, when you need it.  We recommend signing up for the patient portal called "MyChart".  Sign up information is provided on this After Visit Summary.  MyChart is used to connect with patients for Virtual Visits (Telemedicine).  Patients are able to view lab/test results, encounter notes, upcoming appointments, etc.  Non-urgent messages can be sent to your  provider as well.   To learn more about what you can do with MyChart, go to [email or web address].    Your next appointment:   12 week(s)  The format for your next appointment:   In Person  Provider:   Kardie Tobb, DO   

## 2022-06-30 ENCOUNTER — Encounter: Payer: Self-pay | Admitting: Cardiology

## 2022-07-12 ENCOUNTER — Ambulatory Visit (HOSPITAL_COMMUNITY): Payer: BLUE CROSS/BLUE SHIELD | Attending: Cardiology

## 2022-07-12 DIAGNOSIS — O165 Unspecified maternal hypertension, complicating the puerperium: Secondary | ICD-10-CM | POA: Diagnosis not present

## 2022-07-12 LAB — ECHOCARDIOGRAM COMPLETE
Area-P 1/2: 2.96 cm2
S' Lateral: 2.5 cm

## 2022-09-21 ENCOUNTER — Encounter (HOSPITAL_COMMUNITY): Payer: Self-pay | Admitting: Obstetrics and Gynecology

## 2022-11-19 ENCOUNTER — Encounter (HOSPITAL_COMMUNITY): Payer: Self-pay | Admitting: Obstetrics and Gynecology
